# Patient Record
Sex: Male | Born: 1990 | Hispanic: Yes | Marital: Single | Smoking: Never smoker
Health system: Southern US, Community
[De-identification: ages and names within clinical notes are randomized; demographics above are authoritative.]

## PROBLEM LIST (undated history)

## (undated) DIAGNOSIS — D649 Anemia, unspecified: Secondary | ICD-10-CM

## (undated) DIAGNOSIS — W3400XA Accidental discharge from unspecified firearms or gun, initial encounter: Secondary | ICD-10-CM

## (undated) HISTORY — PX: OTHER SURGICAL HISTORY: SHX169

---

## 2014-06-28 DIAGNOSIS — W19XXXA Unspecified fall, initial encounter: Secondary | ICD-10-CM

## 2014-06-28 HISTORY — DX: Unspecified fall, initial encounter: W19.XXXA

## 2017-01-10 ENCOUNTER — Emergency Department: Payer: Self-pay

## 2017-01-10 ENCOUNTER — Encounter: Payer: Self-pay | Admitting: Emergency Medicine

## 2017-01-10 ENCOUNTER — Emergency Department
Admission: EM | Admit: 2017-01-10 | Discharge: 2017-01-10 | Disposition: A | Discharge status: Home / Self Care | Payer: Self-pay | Attending: Emergency Medicine | Admitting: Emergency Medicine

## 2017-01-10 DIAGNOSIS — W11XXXA Fall on and from ladder, initial encounter: Secondary | ICD-10-CM | POA: Insufficient documentation

## 2017-01-10 DIAGNOSIS — Y990 Civilian activity done for income or pay: Secondary | ICD-10-CM | POA: Insufficient documentation

## 2017-01-10 DIAGNOSIS — Y939 Activity, unspecified: Secondary | ICD-10-CM | POA: Insufficient documentation

## 2017-01-10 DIAGNOSIS — Y929 Unspecified place or not applicable: Secondary | ICD-10-CM | POA: Insufficient documentation

## 2017-01-10 DIAGNOSIS — F17200 Nicotine dependence, unspecified, uncomplicated: Secondary | ICD-10-CM | POA: Insufficient documentation

## 2017-01-10 DIAGNOSIS — R51 Headache: Secondary | ICD-10-CM | POA: Insufficient documentation

## 2017-01-10 DIAGNOSIS — S22080A Wedge compression fracture of T11-T12 vertebra, initial encounter for closed fracture: Secondary | ICD-10-CM | POA: Insufficient documentation

## 2017-01-10 LAB — URINALYSIS, COMPLETE (UACMP) WITH MICROSCOPIC
BACTERIA UA: NONE SEEN
BILIRUBIN URINE: NEGATIVE
Glucose, UA: NEGATIVE mg/dL
HGB URINE DIPSTICK: NEGATIVE
Ketones, ur: NEGATIVE mg/dL
LEUKOCYTES UA: NEGATIVE
Nitrite: NEGATIVE
PROTEIN: 30 mg/dL — AB
SPECIFIC GRAVITY, URINE: 1.028 (ref 1.005–1.030)
pH: 6 (ref 5.0–8.0)

## 2017-01-10 MED ORDER — CYCLOBENZAPRINE HCL 10 MG PO TABS: 10.0000 mg | ORAL_TABLET | Freq: Three times a day (TID) | ORAL | 0 refills | Status: DC | PRN

## 2017-01-10 MED ORDER — HYDROCODONE-ACETAMINOPHEN 5-325 MG PO TABS
1.0000 | ORAL_TABLET | Freq: Once | ORAL | Status: AC
  Administered 2017-01-10: 1 via ORAL

## 2017-01-10 MED ORDER — CYCLOBENZAPRINE HCL 10 MG PO TABS
10.0000 mg | ORAL_TABLET | Freq: Once | ORAL | Status: AC
  Administered 2017-01-10: 10 mg via ORAL
  Filled 2017-01-10: qty 1

## 2017-01-10 MED ORDER — NAPROXEN 500 MG PO TABS: 500.0000 mg | ORAL_TABLET | Freq: Two times a day (BID) | ORAL | 0 refills | Status: DC

## 2017-01-10 MED ORDER — HYDROCODONE-ACETAMINOPHEN 5-325 MG PO TABS
ORAL_TABLET | ORAL | Status: AC
  Filled 2017-01-10: qty 1

## 2017-01-10 MED ORDER — NAPROXEN 500 MG PO TABS
500.0000 mg | ORAL_TABLET | Freq: Once | ORAL | Status: AC
  Administered 2017-01-10: 500 mg via ORAL
  Filled 2017-01-10: qty 1

## 2017-01-10 NOTE — Discharge Instructions (Signed)
Schedule an appointment with the back specialist. Return to the ER for symptoms that change or worsen or for new concerns if you are unable to schedule an appointment.

## 2017-01-10 NOTE — ED Provider Notes (Signed)
[facility] Emergency Department Provider Note ____________________________________________  Time seen: Approximately 4:28 PM  I have reviewed the triage vital signs and the nursing notes.   HISTORY  Chief Complaint Back Pain    HPI Mike Sexton is a 26 y.o. male who presents to the emergency department for evaluation after falling about 8 feet from a ladder onto his back at approximately 3pm. He denies loss of consciousness, although he did strike the back of his head on the ground. He has some pain in his neck and back. He denies loss of bowel or bladder control. He denies extremity pain. She has not attempted any alleviating measures for his pain since the incident. He has been ambulatory since the fall.   History reviewed. No pertinent past medical history.  There are no active problems to display for this patient.   History reviewed. No pertinent surgical history.  Prior to Admission medications   Medication Sig Start Date End Date Taking? Authorizing Provider  cyclobenzaprine (FLEXERIL) 10 MG tablet Take 1 tablet (10 mg total) by mouth 3 (three) times daily as needed for muscle spasms. 01/10/17   Trystan Akhtar, Rulon Eisenmenger B, FNP  naproxen (NAPROSYN) 500 MG tablet Take 1 tablet (500 mg total) by mouth 2 (two) times daily with a meal. 01/10/17   Jas Betten B, FNP    Allergies Patient has no known allergies.  No family history on file.  Social History Social History  Substance Use Topics  . Smoking status: Current Some Day Smoker  . Smokeless tobacco: Not on file  . Alcohol use Not on file    Review of Systems Constitutional: Negative for recent illness. Positive for recent injury. Cardiovascular: Negative for chest pain Respiratory: Negative for shortness of breath Musculoskeletal: Positive for neck and lower back pain. Skin: Positive for scalp hematoma  Neurological: Negative for loss of consciousness or loss of bowel or bladder  control  ____________________________________________   PHYSICAL EXAM:  VITAL SIGNS: ED Triage Vitals  Enc Vitals Group     BP 01/10/17 1618 134/71     Pulse Rate 01/10/17 1618 100     Resp 01/10/17 1618 18     Temp 01/10/17 1618 99 F (37.2 C)     Temp Source 01/10/17 1618 Oral     SpO2 01/10/17 1618 97 %     Weight 01/10/17 1618 180 lb (81.6 kg)     Height 01/10/17 1618 5\' 6"  (1.676 m)     Head Circumference --      Peak Flow --      Pain Score 01/10/17 1617 8     Pain Loc --      Pain Edu? --      Excl. in GC? --     Constitutional: Alert and oriented. Well appearing and in no acute distress. Eyes: And entire are clear. Pupils are equal, round, and reactive to light. Pupils are 3 mm.  Head: Palpable hematoma on the parietal aspect of the left scalp. Neck: Midline tenderness in the mid cervical spine with paraspinal tenderness as well. Respiratory: Respirations are even and unlabored. Breath sound audible and clear to auscultation throughout. Musculoskeletal: Pain in the mid and lower thorax and the lumbar spine that worsens with straight leg raise >on right than left.  Neurologic: Awake, alert, and oriented x 4  Skin: Scalp hematoma on the left parietal area. Psychiatric: Behavior appropriate. Normal affect.  ____________________________________________   LABS (all labs ordered are listed, but only abnormal results are  displayed)  Labs Reviewed  URINALYSIS, COMPLETE (UACMP) WITH MICROSCOPIC - Abnormal; Notable for the following:       Result Value   Color, Urine YELLOW (*)    APPearance CLEAR (*)    Protein, ur 30 (*)    Squamous Epithelial / LPF 0-5 (*)    All other components within normal limits   ____________________________________________  RADIOLOGY  EXAM: THORACIC SPINE 2 VIEWS  COMPARISON: Lumbar spine CT obtained earlier today.  FINDINGS: Previously noted approximately 10% superior endplate T12 vertebral body compression deformity. No bony  retropulsion. The remaining vertebrae have normal appearances without fracture or subluxation.  IMPRESSION: Stable approximately 10% superior endplate T12 compression deformity. Otherwise, unremarkable examination.  CT Cervical Spine:  IMPRESSION: 1. No evidence of acute intracranial abnormality. 2. Left parietal scalp hematoma. 3. No acute fracture or subluxation in the cervical spine.  CT Head:  IMPRESSION: 1. No evidence of acute intracranial abnormality. 2. Left parietal scalp hematoma. 3. No acute fracture or subluxation in the cervical spine.  CT Thoracic Spine:  IMPRESSION: 1. T12 compression fracture with minimal vertebral body height loss. 2. 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 3. Hepatic steatosis.  ___________________________________________   PROCEDURES  Procedure(s) performed: None  ____________________________________________   INITIAL IMPRESSION / ASSESSMENT AND PLAN / ED COURSE  Mike Sexton is a 26 y.o. male who presents to the emergency department for evaluation and treatment after falling 6-8 feet from a ladder and landing on his lower back. All images were negative with the exception of the CT scan of his lumbar spine which shows a T12 compression fracture with minimal vertebral body height loss (10% or less.) He will be referred to the neurosurgeon and restricted from work until he is released by his primary care provider or the neurosurgeon. He was given strict emergent return precautions. He was given prescriptions for Flexeril and Naprosyn. He is discharged in the care of his friend who will assist him with the scheduling of appointments.  Pertinent labs & imaging results that were available during my care of the patient were reviewed by me and considered in my medical decision making (see chart for details).  _________________________________________   FINAL CLINICAL IMPRESSION(S) / ED DIAGNOSES  Final diagnoses:   Traumatic compression fracture of T12 thoracic vertebra, closed, initial encounter [facility](HCC)    Discharge Medication List as of 01/10/2017  7:10 PM    START taking these medications   Details  cyclobenzaprine (FLEXERIL) 10 MG tablet Take 1 tablet (10 mg total) by mouth 3 (three) times daily as needed for muscle spasms., Starting Mon 01/10/2017, Print    naproxen (NAPROSYN) 500 MG tablet Take 1 tablet (500 mg total) by mouth 2 (two) times daily with a meal., Starting Mon 01/10/2017, Print        If controlled substance prescribed during this visit, 12 month history viewed on the [facility] prior to issuing an initial prescription for Schedule II or III opiod.    Chinita Pesterriplett, Akia Desroches B, FNP 01/10/17 2204    Phineas SemenGoodman, Graydon, MD 01/10/17 2240

## 2017-01-10 NOTE — ED Triage Notes (Signed)
Fell from ladder onto back approx 3pm. Pain lower back. Fell approx 4 to 6 feet per coworker.

## 2017-01-10 NOTE — ED Triage Notes (Signed)
Denies LOC at fall.

## 2017-01-10 NOTE — ED Notes (Signed)
Discharged patient with translator Mike Sexton. Reviewed d/c instructions, follow-up care, prescriptions with patient. Pt verbalized understanding

## 2017-01-10 NOTE — ED Notes (Signed)
See triage note  States he fell approx 4 ft from ladder  Having pain to lower back  Ambulates slowly d/t increased pain

## 2019-06-12 ENCOUNTER — Encounter: Payer: Self-pay | Admitting: Physician Assistant

## 2019-07-04 ENCOUNTER — Ambulatory Visit: Payer: Self-pay | Admitting: Physician Assistant

## 2020-02-19 ENCOUNTER — Inpatient Hospital Stay (HOSPITAL_COMMUNITY)
Admission: EM | Admit: 2020-02-19 | Discharge: 2020-02-25 | DRG: 964 | Disposition: A | Discharge status: Rehabilitation Facility | Payer: No Typology Code available for payment source | Attending: General Surgery | Admitting: General Surgery

## 2020-02-19 ENCOUNTER — Emergency Department (HOSPITAL_COMMUNITY): Payer: No Typology Code available for payment source

## 2020-02-19 DIAGNOSIS — S27321A Contusion of lung, unilateral, initial encounter: Secondary | ICD-10-CM | POA: Diagnosis present

## 2020-02-19 DIAGNOSIS — S022XXA Fracture of nasal bones, initial encounter for closed fracture: Secondary | ICD-10-CM | POA: Diagnosis present

## 2020-02-19 DIAGNOSIS — S069X0A Unspecified intracranial injury without loss of consciousness, initial encounter: Principal | ICD-10-CM | POA: Diagnosis present

## 2020-02-19 DIAGNOSIS — S0240CA Maxillary fracture, right side, initial encounter for closed fracture: Secondary | ICD-10-CM | POA: Diagnosis present

## 2020-02-19 DIAGNOSIS — J9690 Respiratory failure, unspecified, unspecified whether with hypoxia or hypercapnia: Secondary | ICD-10-CM

## 2020-02-19 DIAGNOSIS — J939 Pneumothorax, unspecified: Secondary | ICD-10-CM

## 2020-02-19 DIAGNOSIS — S0240DA Maxillary fracture, left side, initial encounter for closed fracture: Secondary | ICD-10-CM | POA: Diagnosis present

## 2020-02-19 DIAGNOSIS — S0231XA Fracture of orbital floor, right side, initial encounter for closed fracture: Secondary | ICD-10-CM | POA: Diagnosis present

## 2020-02-19 DIAGNOSIS — Z23 Encounter for immunization: Secondary | ICD-10-CM

## 2020-02-19 DIAGNOSIS — T1490XA Injury, unspecified, initial encounter: Secondary | ICD-10-CM

## 2020-02-19 DIAGNOSIS — S27809A Unspecified injury of diaphragm, initial encounter: Secondary | ICD-10-CM | POA: Diagnosis present

## 2020-02-19 DIAGNOSIS — S0285XA Fracture of orbit, unspecified, initial encounter for closed fracture: Secondary | ICD-10-CM

## 2020-02-19 DIAGNOSIS — Z20822 Contact with and (suspected) exposure to covid-19: Secondary | ICD-10-CM | POA: Diagnosis present

## 2020-02-19 DIAGNOSIS — W3400XA Accidental discharge from unspecified firearms or gun, initial encounter: Secondary | ICD-10-CM | POA: Diagnosis not present

## 2020-02-19 DIAGNOSIS — S272XXA Traumatic hemopneumothorax, initial encounter: Secondary | ICD-10-CM | POA: Diagnosis present

## 2020-02-19 DIAGNOSIS — S0292XA Unspecified fracture of facial bones, initial encounter for closed fracture: Secondary | ICD-10-CM

## 2020-02-19 DIAGNOSIS — Z87828 Personal history of other (healed) physical injury and trauma: Secondary | ICD-10-CM

## 2020-02-19 DIAGNOSIS — S22039A Unspecified fracture of third thoracic vertebra, initial encounter for closed fracture: Secondary | ICD-10-CM | POA: Diagnosis present

## 2020-02-19 DIAGNOSIS — S066X0A Traumatic subarachnoid hemorrhage without loss of consciousness, initial encounter: Secondary | ICD-10-CM | POA: Diagnosis present

## 2020-02-19 LAB — COMPREHENSIVE METABOLIC PANEL
ALT: 114 U/L — ABNORMAL HIGH (ref 0–44)
AST: 80 U/L — ABNORMAL HIGH (ref 15–41)
Albumin: 3.7 g/dL (ref 3.5–5.0)
Alkaline Phosphatase: 42 U/L (ref 38–126)
Anion gap: 14 (ref 5–15)
BUN: 16 mg/dL (ref 6–20)
CO2: 17 mmol/L — ABNORMAL LOW (ref 22–32)
Calcium: 8.7 mg/dL — ABNORMAL LOW (ref 8.9–10.3)
Chloride: 105 mmol/L (ref 98–111)
Creatinine, Ser: 1.02 mg/dL (ref 0.61–1.24)
GFR calc Af Amer: >60 mL/min (ref >60)
GFR calc non Af Amer: >60 mL/min (ref >60)
Glucose, Bld: 220 mg/dL — ABNORMAL HIGH (ref 70–99)
Potassium: 3.4 mmol/L — ABNORMAL LOW (ref 3.5–5.1)
Sodium: 136 mmol/L (ref 135–145)
Total Bilirubin: 1.3 mg/dL — ABNORMAL HIGH (ref 0.3–1.2)
Total Protein: 6.4 g/dL — ABNORMAL LOW (ref 6.5–8.1)

## 2020-02-19 LAB — SARS CORONAVIRUS 2 BY RT PCR (HOSPITAL ORDER, PERFORMED IN ~~LOC~~ HOSPITAL LAB): SARS Coronavirus 2: NEGATIVE

## 2020-02-19 LAB — CBC
HCT: 39 % (ref 39.0–52.0)
HCT: 33.4 % — ABNORMAL LOW (ref 39.0–52.0)
Hemoglobin: 12.6 g/dL — ABNORMAL LOW (ref 13.0–17.0)
Hemoglobin: 11 g/dL — ABNORMAL LOW (ref 13.0–17.0)
MCH: 29 pg (ref 26.0–34.0)
MCH: 29.6 pg (ref 26.0–34.0)
MCHC: 32.3 g/dL (ref 30.0–36.0)
MCHC: 32.9 g/dL (ref 30.0–36.0)
MCV: 89.7 fL (ref 80.0–100.0)
MCV: 89.8 fL (ref 80.0–100.0)
Platelets: 300 10*3/uL (ref 150–400)
Platelets: 261 10*3/uL (ref 150–400)
RBC: 4.35 MIL/uL (ref 4.22–5.81)
RBC: 3.72 MIL/uL — ABNORMAL LOW (ref 4.22–5.81)
RDW: 12.8 % (ref 11.5–15.5)
RDW: 12.9 % (ref 11.5–15.5)
WBC: 9.6 10*3/uL (ref 4.0–10.5)
WBC: 15.5 10*3/uL — ABNORMAL HIGH (ref 4.0–10.5)
nRBC: 0 % (ref 0.0–0.2)
nRBC: 0 % (ref 0.0–0.2)

## 2020-02-19 LAB — URINALYSIS, ROUTINE W REFLEX MICROSCOPIC
Bilirubin Urine: NEGATIVE
Glucose, UA: NEGATIVE mg/dL
Hgb urine dipstick: NEGATIVE
Ketones, ur: NEGATIVE mg/dL
Leukocytes,Ua: NEGATIVE
Nitrite: NEGATIVE
Protein, ur: NEGATIVE mg/dL
Specific Gravity, Urine: >1.046 — ABNORMAL HIGH (ref 1.005–1.030)
pH: 6 (ref 5.0–8.0)

## 2020-02-19 LAB — PROTIME-INR
INR: 1 (ref 0.8–1.2)
Prothrombin Time: 13.2 seconds (ref 11.4–15.2)

## 2020-02-19 LAB — ETHANOL: Alcohol, Ethyl (B): <10 mg/dL (ref <10)

## 2020-02-19 LAB — I-STAT CHEM 8, ED
BUN: 17 mg/dL (ref 6–20)
Calcium, Ion: 1.07 mmol/L — ABNORMAL LOW (ref 1.15–1.40)
Chloride: 104 mmol/L (ref 98–111)
Creatinine, Ser: 0.9 mg/dL (ref 0.61–1.24)
Glucose, Bld: 217 mg/dL — ABNORMAL HIGH (ref 70–99)
HCT: 37 % — ABNORMAL LOW (ref 39.0–52.0)
Hemoglobin: 12.6 g/dL — ABNORMAL LOW (ref 13.0–17.0)
Potassium: 3.4 mmol/L — ABNORMAL LOW (ref 3.5–5.1)
Sodium: 138 mmol/L (ref 135–145)
TCO2: 20 mmol/L — ABNORMAL LOW (ref 22–32)

## 2020-02-19 LAB — SAMPLE TO BLOOD BANK

## 2020-02-19 LAB — LACTIC ACID, PLASMA: Lactic Acid, Venous: 2.5 mmol/L (ref 0.5–1.9)

## 2020-02-19 LAB — HIV ANTIBODY (ROUTINE TESTING W REFLEX): HIV Screen 4th Generation wRfx: NONREACTIVE

## 2020-02-19 LAB — GLUCOSE, CAPILLARY: Glucose-Capillary: 137 mg/dL — ABNORMAL HIGH (ref 70–99)

## 2020-02-19 MED ORDER — ACETAMINOPHEN 325 MG PO TABS
650.0000 mg | ORAL_TABLET | Freq: Four times a day (QID) | ORAL | Status: DC
  Administered 2020-02-19 – 2020-02-20 (×4): 650 mg via ORAL
  Filled 2020-02-19 (×4): qty 2

## 2020-02-19 MED ORDER — ONDANSETRON HCL 4 MG/2ML IJ SOLN
4.0000 mg | Freq: Four times a day (QID) | INTRAMUSCULAR | Status: DC | PRN
  Administered 2020-02-19 – 2020-02-24 (×4): 4 mg via INTRAVENOUS
  Filled 2020-02-19 (×4): qty 2

## 2020-02-19 MED ORDER — LORAZEPAM 2 MG/ML IJ SOLN: 1.0000 mg | INTRAMUSCULAR | Status: DC | PRN

## 2020-02-19 MED ORDER — LEVETIRACETAM IN NACL 500 MG/100ML IV SOLN
500.0000 mg | Freq: Two times a day (BID) | INTRAVENOUS | Status: DC
  Administered 2020-02-19 – 2020-02-25 (×13): 500 mg via INTRAVENOUS
  Filled 2020-02-19 (×13): qty 100

## 2020-02-19 MED ORDER — OXYCODONE HCL 5 MG PO TABS
10.0000 mg | ORAL_TABLET | ORAL | Status: DC | PRN
  Administered 2020-02-19 – 2020-02-25 (×22): 10 mg via ORAL
  Filled 2020-02-19 (×23): qty 2

## 2020-02-19 MED ORDER — OXYCODONE HCL 5 MG PO TABS
5.0000 mg | ORAL_TABLET | ORAL | Status: DC | PRN
  Administered 2020-02-20 – 2020-02-24 (×3): 5 mg via ORAL
  Filled 2020-02-19 (×3): qty 1

## 2020-02-19 MED ORDER — GABAPENTIN 300 MG PO CAPS
300.0000 mg | ORAL_CAPSULE | Freq: Three times a day (TID) | ORAL | Status: DC
  Administered 2020-02-19 – 2020-02-22 (×10): 300 mg via ORAL
  Filled 2020-02-19 (×10): qty 1

## 2020-02-19 MED ORDER — LEVETIRACETAM 500 MG PO TABS: 500.0000 mg | ORAL_TABLET | Freq: Two times a day (BID) | ORAL | Status: DC

## 2020-02-19 MED ORDER — PROCHLORPERAZINE EDISYLATE 10 MG/2ML IJ SOLN
10.0000 mg | Freq: Four times a day (QID) | INTRAMUSCULAR | Status: DC | PRN
  Administered 2020-02-19: 10 mg via INTRAVENOUS
  Filled 2020-02-19: qty 2

## 2020-02-19 MED ORDER — SODIUM CHLORIDE 0.9 % IV SOLN: Freq: Once | INTRAVENOUS | Status: AC

## 2020-02-19 MED ORDER — TETANUS-DIPHTH-ACELL PERTUSSIS 5-2.5-18.5 LF-MCG/0.5 IM SUSP
INTRAMUSCULAR | Status: AC
  Administered 2020-02-19: 0.5 mL via INTRAMUSCULAR
  Filled 2020-02-19: qty 0.5

## 2020-02-19 MED ORDER — CHLORHEXIDINE GLUCONATE CLOTH 2 % EX PADS
6.0000 | MEDICATED_PAD | Freq: Every day | CUTANEOUS | Status: DC
  Administered 2020-02-23 – 2020-02-25 (×3): 6 via TOPICAL

## 2020-02-19 MED ORDER — POTASSIUM CHLORIDE IN NACL 20-0.9 MEQ/L-% IV SOLN
INTRAVENOUS | Status: DC
  Filled 2020-02-19 (×3): qty 1000

## 2020-02-19 MED ORDER — ONDANSETRON 4 MG PO TBDP: 4.0000 mg | ORAL_TABLET | Freq: Four times a day (QID) | ORAL | Status: DC | PRN

## 2020-02-19 MED ORDER — FENTANYL CITRATE (PF) 100 MCG/2ML IJ SOLN
INTRAMUSCULAR | Status: AC | PRN
  Administered 2020-02-19: 50 ug via INTRAVENOUS

## 2020-02-19 MED ORDER — CEFAZOLIN SODIUM-DEXTROSE 2-4 GM/100ML-% IV SOLN
2.0000 g | Freq: Once | INTRAVENOUS | Status: AC
  Administered 2020-02-19: 2 g via INTRAVENOUS
  Filled 2020-02-19: qty 100

## 2020-02-19 MED ORDER — HYDROMORPHONE HCL 1 MG/ML IJ SOLN
0.5000 mg | INTRAMUSCULAR | Status: DC | PRN
  Administered 2020-02-19 – 2020-02-20 (×5): 1 mg via INTRAVENOUS
  Administered 2020-02-21 (×2): 0.5 mg via INTRAVENOUS
  Administered 2020-02-21: 1 mg via INTRAVENOUS
  Administered 2020-02-22: 0.5 mg via INTRAVENOUS
  Filled 2020-02-19 (×11): qty 1

## 2020-02-19 MED ORDER — METHOCARBAMOL 1000 MG/10ML IJ SOLN
1000.0000 mg | Freq: Three times a day (TID) | INTRAVENOUS | Status: DC | PRN
  Filled 2020-02-19 (×2): qty 10

## 2020-02-19 MED ORDER — FENTANYL CITRATE (PF) 100 MCG/2ML IJ SOLN
INTRAMUSCULAR | Status: AC
  Filled 2020-02-19: qty 2

## 2020-02-19 MED ORDER — IOHEXOL 300 MG/ML  SOLN
100.0000 mL | Freq: Once | INTRAMUSCULAR | Status: AC | PRN
  Administered 2020-02-19: 100 mL via INTRAVENOUS

## 2020-02-19 MED ORDER — ENOXAPARIN SODIUM 30 MG/0.3ML ~~LOC~~ SOLN: 30.0000 mg | Freq: Two times a day (BID) | SUBCUTANEOUS | Status: DC

## 2020-02-19 NOTE — Progress Notes (Signed)
Orthopedic Tech Progress Note Patient Details:  Mike Sexton 06/13/1991 [id number] Called in order to [employer] for a CTO Patient ID: Mike Sexton, male   DOB: [birth year], 29 y.o.   MRN: [id number]   [name] 02/19/2020, 9:32 AM
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
## 2020-02-19 NOTE — Progress Notes (Signed)
Rehab Admissions Coordinator Note:  Patient was screened by Clois Dupes for appropriateness for an Inpatient Acute Rehab Consult per PT recs. Await further progress with therapy before pursuing rehab consult/assesment. I will follow.   Clois Dupes RN MSN 02/19/2020, 4:42 PM  I can be reached at [phone].
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
## 2020-02-19 NOTE — ED Notes (Signed)
Family updated as to patient's status.
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
## 2020-02-20 ENCOUNTER — Inpatient Hospital Stay (HOSPITAL_COMMUNITY): Payer: No Typology Code available for payment source

## 2020-02-20 LAB — CBC
HCT: 29.3 % — ABNORMAL LOW (ref 39.0–52.0)
Hemoglobin: 9.7 g/dL — ABNORMAL LOW (ref 13.0–17.0)
MCH: 29.8 pg (ref 26.0–34.0)
MCHC: 33.1 g/dL (ref 30.0–36.0)
MCV: 89.9 fL (ref 80.0–100.0)
Platelets: 206 10*3/uL (ref 150–400)
RBC: 3.26 MIL/uL — ABNORMAL LOW (ref 4.22–5.81)
RDW: 13.2 % (ref 11.5–15.5)
WBC: 11.1 10*3/uL — ABNORMAL HIGH (ref 4.0–10.5)
nRBC: 0 % (ref 0.0–0.2)

## 2020-02-20 LAB — BASIC METABOLIC PANEL
Anion gap: 7 (ref 5–15)
BUN: 13 mg/dL (ref 6–20)
CO2: 23 mmol/L (ref 22–32)
Calcium: 8 mg/dL — ABNORMAL LOW (ref 8.9–10.3)
Chloride: 104 mmol/L (ref 98–111)
Creatinine, Ser: 0.65 mg/dL (ref 0.61–1.24)
GFR calc Af Amer: >60 mL/min (ref >60)
GFR calc non Af Amer: >60 mL/min (ref >60)
Glucose, Bld: 141 mg/dL — ABNORMAL HIGH (ref 70–99)
Potassium: 4.2 mmol/L (ref 3.5–5.1)
Sodium: 134 mmol/L — ABNORMAL LOW (ref 135–145)

## 2020-02-20 LAB — MRSA PCR SCREENING: MRSA by PCR: NEGATIVE

## 2020-02-20 MED ORDER — LORAZEPAM 2 MG/ML IJ SOLN: 0.5000 mg | INTRAMUSCULAR | Status: DC | PRN

## 2020-02-20 MED ORDER — ACETAMINOPHEN 500 MG PO TABS
1000.0000 mg | ORAL_TABLET | Freq: Four times a day (QID) | ORAL | Status: DC
  Administered 2020-02-20 – 2020-02-25 (×20): 1000 mg via ORAL
  Filled 2020-02-20 (×21): qty 2

## 2020-02-20 MED ORDER — METHOCARBAMOL 500 MG PO TABS
1000.0000 mg | ORAL_TABLET | Freq: Three times a day (TID) | ORAL | Status: DC
  Administered 2020-02-20 – 2020-02-25 (×17): 1000 mg via ORAL
  Filled 2020-02-20 (×18): qty 2

## 2020-02-20 MED ORDER — LACTATED RINGERS IV BOLUS
1000.0000 mL | Freq: Once | INTRAVENOUS | Status: AC
  Administered 2020-02-20: 1000 mL via INTRAVENOUS
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
## 2020-02-20 NOTE — Progress Notes (Signed)
Inpatient Rehabilitation Admissions Coordinator  Inpt rehab consult for assessment is recommended. Please place order for rehab consult when you feel appropriate. Please advise.  Ottie Glazier, RN, MSN Rehab Admissions Coordinator [phone] 02/20/2020 12:17 PM
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
## 2020-02-20 NOTE — Progress Notes (Signed)
Patient unchanged overnight.  Reviewed note from ophthalmology yesterday afternoon recommending oculoplastics consultation due to extraocular movement restriction.  This is likely due to the posterolateral orbital fracture and bony fragment impairing function of the lateral rectus.  This is outside of my scope of practice, and I would defer to ophthalmology and oculoplastics colleagues for any surgical recommendations.  No ENT related intervention needed at this time.

## 2020-02-20 NOTE — Plan of Care (Signed)
  Problem: Clinical Measurements: Goal: Will remain free from infection Outcome: Progressing Goal: Respiratory complications will improve Outcome: Progressing Goal: Cardiovascular complication will be avoided Outcome: Progressing   Problem: Activity: Goal: Risk for activity intolerance will decrease Outcome: Progressing   Problem: Nutrition: Goal: Adequate nutrition will be maintained Outcome: Progressing   Problem: Pain Managment: Goal: General experience of comfort will improve Outcome: Progressing   Problem: Safety: Goal: Ability to remain free from injury will improve Outcome: Progressing   Problem: Skin Integrity: Goal: Risk for impaired skin integrity will decrease Outcome: Progressing   Problem: Elimination: Goal: Will not experience complications related to bowel motility Outcome: Not Progressing Goal: Will not experience complications related to urinary retention Outcome: Not Progressing
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
## 2020-02-21 ENCOUNTER — Encounter (HOSPITAL_COMMUNITY): Payer: Self-pay | Admitting: General Surgery

## 2020-02-21 DIAGNOSIS — W3400XA Accidental discharge from unspecified firearms or gun, initial encounter: Secondary | ICD-10-CM

## 2020-02-21 DIAGNOSIS — G5721 Lesion of femoral nerve, right lower limb: Secondary | ICD-10-CM

## 2020-02-21 DIAGNOSIS — G5711 Meralgia paresthetica, right lower limb: Secondary | ICD-10-CM

## 2020-02-21 LAB — CBC
HCT: 25.3 % — ABNORMAL LOW (ref 39.0–52.0)
Hemoglobin: 8.4 g/dL — ABNORMAL LOW (ref 13.0–17.0)
MCH: 29.5 pg (ref 26.0–34.0)
MCHC: 33.2 g/dL (ref 30.0–36.0)
MCV: 88.8 fL (ref 80.0–100.0)
Platelets: 212 10*3/uL (ref 150–400)
RBC: 2.85 MIL/uL — ABNORMAL LOW (ref 4.22–5.81)
RDW: 12.7 % (ref 11.5–15.5)
WBC: 9.9 10*3/uL (ref 4.0–10.5)
nRBC: 0 % (ref 0.0–0.2)

## 2020-02-21 MED ORDER — TAMSULOSIN HCL 0.4 MG PO CAPS
0.4000 mg | ORAL_CAPSULE | Freq: Every day | ORAL | Status: DC
  Administered 2020-02-21 – 2020-02-25 (×5): 0.4 mg via ORAL
  Filled 2020-02-21 (×5): qty 1

## 2020-02-21 MED ORDER — POTASSIUM CHLORIDE IN NACL 20-0.9 MEQ/L-% IV SOLN
INTRAVENOUS | Status: DC
  Filled 2020-02-21 (×6): qty 1000

## 2020-02-21 MED ORDER — SODIUM CHLORIDE 0.9 % IV SOLN: INTRAVENOUS | Status: DC

## 2020-02-21 MED ORDER — BETHANECHOL CHLORIDE 25 MG PO TABS
25.0000 mg | ORAL_TABLET | Freq: Three times a day (TID) | ORAL | Status: DC
  Administered 2020-02-21 – 2020-02-25 (×13): 25 mg via ORAL
  Filled 2020-02-21 (×9): qty 1
  Filled 2020-02-21: qty 3
  Filled 2020-02-21 (×3): qty 1
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
## 2020-02-21 NOTE — Progress Notes (Signed)
Pt arrived to 4NP06 from 4NICU. Report received from [place], [place].   Robina Ade, RN
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
## 2020-02-22 ENCOUNTER — Inpatient Hospital Stay (HOSPITAL_COMMUNITY): Payer: No Typology Code available for payment source

## 2020-02-22 LAB — BASIC METABOLIC PANEL
Anion gap: 8 (ref 5–15)
BUN: 7 mg/dL (ref 6–20)
CO2: 27 mmol/L (ref 22–32)
Calcium: 8.3 mg/dL — ABNORMAL LOW (ref 8.9–10.3)
Chloride: 100 mmol/L (ref 98–111)
Creatinine, Ser: 0.76 mg/dL (ref 0.61–1.24)
GFR calc Af Amer: >60 mL/min (ref >60)
GFR calc non Af Amer: >60 mL/min (ref >60)
Glucose, Bld: 116 mg/dL — ABNORMAL HIGH (ref 70–99)
Potassium: 3.7 mmol/L (ref 3.5–5.1)
Sodium: 135 mmol/L (ref 135–145)

## 2020-02-22 LAB — CBC
HCT: 26.6 % — ABNORMAL LOW (ref 39.0–52.0)
Hemoglobin: 8.8 g/dL — ABNORMAL LOW (ref 13.0–17.0)
MCH: 29.1 pg (ref 26.0–34.0)
MCHC: 33.1 g/dL (ref 30.0–36.0)
MCV: 88.1 fL (ref 80.0–100.0)
Platelets: 233 10*3/uL (ref 150–400)
RBC: 3.02 MIL/uL — ABNORMAL LOW (ref 4.22–5.81)
RDW: 12.7 % (ref 11.5–15.5)
WBC: 9.1 10*3/uL (ref 4.0–10.5)
nRBC: 0 % (ref 0.0–0.2)

## 2020-02-22 MED ORDER — KETOROLAC TROMETHAMINE 15 MG/ML IJ SOLN
30.0000 mg | Freq: Four times a day (QID) | INTRAMUSCULAR | Status: DC
  Administered 2020-02-22 – 2020-02-25 (×13): 30 mg via INTRAVENOUS
  Filled 2020-02-22 (×13): qty 2

## 2020-02-22 MED ORDER — HYDROMORPHONE HCL 1 MG/ML IJ SOLN
0.5000 mg | Freq: Four times a day (QID) | INTRAMUSCULAR | Status: DC | PRN
  Administered 2020-02-24: 0.5 mg via INTRAVENOUS
  Filled 2020-02-22: qty 1

## 2020-02-22 MED ORDER — GABAPENTIN 300 MG PO CAPS
600.0000 mg | ORAL_CAPSULE | Freq: Three times a day (TID) | ORAL | Status: DC
  Administered 2020-02-22 – 2020-02-25 (×9): 600 mg via ORAL
  Filled 2020-02-22 (×9): qty 2

## 2020-02-22 MED ORDER — LIDOCAINE 5 % EX PTCH
1.0000 | MEDICATED_PATCH | CUTANEOUS | Status: DC
  Administered 2020-02-22 – 2020-02-25 (×4): 1 via TRANSDERMAL
  Filled 2020-02-22 (×4): qty 1
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
## 2020-02-23 NOTE — Progress Notes (Signed)
Patient bladder scanned and 408 recorded. Dr. Dwain Sarna notified and orders given to place foley. Patient tolerated well.
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
## 2020-02-24 LAB — BASIC METABOLIC PANEL
Anion gap: 8 (ref 5–15)
BUN: 7 mg/dL (ref 6–20)
CO2: 25 mmol/L (ref 22–32)
Calcium: 8.5 mg/dL — ABNORMAL LOW (ref 8.9–10.3)
Chloride: 103 mmol/L (ref 98–111)
Creatinine, Ser: 0.67 mg/dL (ref 0.61–1.24)
GFR calc Af Amer: >60 mL/min (ref >60)
GFR calc non Af Amer: >60 mL/min (ref >60)
Glucose, Bld: 107 mg/dL — ABNORMAL HIGH (ref 70–99)
Potassium: 4 mmol/L (ref 3.5–5.1)
Sodium: 136 mmol/L (ref 135–145)

## 2020-02-24 LAB — GLUCOSE, CAPILLARY: Glucose-Capillary: 89 mg/dL (ref 70–99)

## 2020-02-24 LAB — CBC
HCT: 26.4 % — ABNORMAL LOW (ref 39.0–52.0)
Hemoglobin: 8.7 g/dL — ABNORMAL LOW (ref 13.0–17.0)
MCH: 28.9 pg (ref 26.0–34.0)
MCHC: 33 g/dL (ref 30.0–36.0)
MCV: 87.7 fL (ref 80.0–100.0)
Platelets: 296 10*3/uL (ref 150–400)
RBC: 3.01 MIL/uL — ABNORMAL LOW (ref 4.22–5.81)
RDW: 12.9 % (ref 11.5–15.5)
WBC: 7.5 10*3/uL (ref 4.0–10.5)
nRBC: 0.5 % — ABNORMAL HIGH (ref 0.0–0.2)
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
## 2020-02-25 ENCOUNTER — Encounter (HOSPITAL_COMMUNITY): Payer: Self-pay | Admitting: Physical Medicine & Rehabilitation

## 2020-02-25 ENCOUNTER — Other Ambulatory Visit: Payer: Self-pay

## 2020-02-25 ENCOUNTER — Inpatient Hospital Stay (HOSPITAL_COMMUNITY): Payer: No Typology Code available for payment source

## 2020-02-25 ENCOUNTER — Inpatient Hospital Stay (HOSPITAL_COMMUNITY)
Admission: RE | Admit: 2020-02-25 | Discharge: 2020-03-21 | DRG: 560 | Disposition: A | Discharge status: Home Health | Payer: No Typology Code available for payment source | Source: Intra-hospital | Attending: Physical Medicine & Rehabilitation | Admitting: Physical Medicine & Rehabilitation

## 2020-02-25 ENCOUNTER — Encounter: Payer: Self-pay | Admitting: Emergency Medicine

## 2020-02-25 DIAGNOSIS — F419 Anxiety disorder, unspecified: Secondary | ICD-10-CM | POA: Diagnosis not present

## 2020-02-25 DIAGNOSIS — S066X9D Traumatic subarachnoid hemorrhage with loss of consciousness of unspecified duration, subsequent encounter: Secondary | ICD-10-CM | POA: Diagnosis not present

## 2020-02-25 DIAGNOSIS — W3400XD Accidental discharge from unspecified firearms or gun, subsequent encounter: Secondary | ICD-10-CM | POA: Diagnosis not present

## 2020-02-25 DIAGNOSIS — G5791 Unspecified mononeuropathy of right lower limb: Secondary | ICD-10-CM | POA: Diagnosis present

## 2020-02-25 DIAGNOSIS — R339 Retention of urine, unspecified: Secondary | ICD-10-CM

## 2020-02-25 DIAGNOSIS — G5711 Meralgia paresthetica, right lower limb: Secondary | ICD-10-CM | POA: Diagnosis present

## 2020-02-25 DIAGNOSIS — S22031D Stable burst fracture of third thoracic vertebra, subsequent encounter for fracture with routine healing: Secondary | ICD-10-CM

## 2020-02-25 DIAGNOSIS — I959 Hypotension, unspecified: Secondary | ICD-10-CM | POA: Diagnosis not present

## 2020-02-25 DIAGNOSIS — K5900 Constipation, unspecified: Secondary | ICD-10-CM | POA: Diagnosis present

## 2020-02-25 DIAGNOSIS — W3400XA Accidental discharge from unspecified firearms or gun, initial encounter: Secondary | ICD-10-CM

## 2020-02-25 DIAGNOSIS — E876 Hypokalemia: Secondary | ICD-10-CM

## 2020-02-25 DIAGNOSIS — S0285XA Fracture of orbit, unspecified, initial encounter for closed fracture: Secondary | ICD-10-CM | POA: Diagnosis present

## 2020-02-25 DIAGNOSIS — Z87828 Personal history of other (healed) physical injury and trauma: Secondary | ICD-10-CM

## 2020-02-25 DIAGNOSIS — G5721 Lesion of femoral nerve, right lower limb: Secondary | ICD-10-CM | POA: Diagnosis present

## 2020-02-25 DIAGNOSIS — S27809D Unspecified injury of diaphragm, subsequent encounter: Secondary | ICD-10-CM | POA: Diagnosis not present

## 2020-02-25 DIAGNOSIS — K5903 Drug induced constipation: Secondary | ICD-10-CM | POA: Diagnosis not present

## 2020-02-25 DIAGNOSIS — D62 Acute posthemorrhagic anemia: Secondary | ICD-10-CM

## 2020-02-25 DIAGNOSIS — S0219XD Other fracture of base of skull, subsequent encounter for fracture with routine healing: Secondary | ICD-10-CM | POA: Diagnosis present

## 2020-02-25 DIAGNOSIS — S0285XD Fracture of orbit, unspecified, subsequent encounter for fracture with routine healing: Secondary | ICD-10-CM

## 2020-02-25 DIAGNOSIS — S022XXD Fracture of nasal bones, subsequent encounter for fracture with routine healing: Secondary | ICD-10-CM

## 2020-02-25 DIAGNOSIS — R0989 Other specified symptoms and signs involving the circulatory and respiratory systems: Secondary | ICD-10-CM

## 2020-02-25 DIAGNOSIS — Z833 Family history of diabetes mellitus: Secondary | ICD-10-CM | POA: Diagnosis not present

## 2020-02-25 MED ORDER — CHLORHEXIDINE GLUCONATE CLOTH 2 % EX PADS: 6.0000 | MEDICATED_PAD | Freq: Every day | CUTANEOUS | Status: DC

## 2020-02-25 MED ORDER — GABAPENTIN 300 MG PO CAPS
600.0000 mg | ORAL_CAPSULE | Freq: Three times a day (TID) | ORAL | Status: DC
  Administered 2020-02-25 – 2020-03-21 (×75): 600 mg via ORAL
  Filled 2020-02-25 (×52): qty 2
  Filled 2020-02-25: qty 6
  Filled 2020-02-25 (×23): qty 2

## 2020-02-25 MED ORDER — BETHANECHOL CHLORIDE 25 MG PO TABS
25.0000 mg | ORAL_TABLET | Freq: Three times a day (TID) | ORAL | Status: DC
  Administered 2020-02-25 – 2020-03-17 (×63): 25 mg via ORAL
  Filled 2020-02-25 (×67): qty 1

## 2020-02-25 MED ORDER — LIDOCAINE 5 % EX PTCH
1.0000 | MEDICATED_PATCH | CUTANEOUS | Status: DC
  Administered 2020-02-26 – 2020-03-21 (×25): 1 via TRANSDERMAL
  Filled 2020-02-25 (×24): qty 1

## 2020-02-25 MED ORDER — LEVETIRACETAM 500 MG PO TABS
500.0000 mg | ORAL_TABLET | Freq: Two times a day (BID) | ORAL | Status: DC
  Administered 2020-02-25 – 2020-02-27 (×4): 500 mg via ORAL
  Filled 2020-02-25 (×4): qty 1

## 2020-02-25 MED ORDER — HYDROMORPHONE HCL 1 MG/ML IJ SOLN: 0.5000 mg | Freq: Three times a day (TID) | INTRAMUSCULAR | Status: DC | PRN

## 2020-02-25 MED ORDER — NAPHAZOLINE-GLYCERIN 0.012-0.2 % OP SOLN
2.0000 [drp] | Freq: Three times a day (TID) | OPHTHALMIC | Status: DC
  Administered 2020-02-25 – 2020-03-21 (×98): 2 [drp] via OPHTHALMIC
  Filled 2020-02-25: qty 15

## 2020-02-25 MED ORDER — FAMOTIDINE 20 MG PO TABS
20.0000 mg | ORAL_TABLET | Freq: Two times a day (BID) | ORAL | Status: DC
  Administered 2020-02-25 – 2020-03-21 (×50): 20 mg via ORAL
  Filled 2020-02-25 (×52): qty 1

## 2020-02-25 MED ORDER — FLEET ENEMA 7-19 GM/118ML RE ENEM: 1.0000 | ENEMA | Freq: Once | RECTAL | Status: DC | PRN

## 2020-02-25 MED ORDER — ACETAMINOPHEN 325 MG PO TABS
650.0000 mg | ORAL_TABLET | Freq: Three times a day (TID) | ORAL | Status: DC
  Administered 2020-02-25: 650 mg via ORAL
  Administered 2020-02-25: 325 mg via ORAL
  Administered 2020-02-26 – 2020-03-21 (×97): 650 mg via ORAL
  Filled 2020-02-25 (×99): qty 2

## 2020-02-25 MED ORDER — POLYETHYLENE GLYCOL 3350 17 G PO PACK: 17.0000 g | PACK | Freq: Every day | ORAL | Status: DC | PRN

## 2020-02-25 MED ORDER — OXYCODONE HCL 5 MG PO TABS
5.0000 mg | ORAL_TABLET | ORAL | Status: DC | PRN
  Administered 2020-02-26 – 2020-03-05 (×22): 10 mg via ORAL
  Administered 2020-03-09: 5 mg via ORAL
  Administered 2020-03-10 – 2020-03-11 (×3): 10 mg via ORAL
  Administered 2020-03-11: 5 mg via ORAL
  Administered 2020-03-11 – 2020-03-18 (×6): 10 mg via ORAL
  Filled 2020-02-25 (×2): qty 2
  Filled 2020-02-25: qty 1
  Filled 2020-02-25 (×27): qty 2
  Filled 2020-02-25: qty 1
  Filled 2020-02-25 (×6): qty 2

## 2020-02-25 MED ORDER — POLYETHYLENE GLYCOL 3350 17 G PO PACK
17.0000 g | PACK | Freq: Two times a day (BID) | ORAL | Status: DC
  Administered 2020-02-25 – 2020-03-21 (×47): 17 g via ORAL
  Filled 2020-02-25 (×48): qty 1

## 2020-02-25 MED ORDER — ONDANSETRON HCL 4 MG/2ML IJ SOLN: 4.0000 mg | Freq: Four times a day (QID) | INTRAMUSCULAR | Status: DC | PRN

## 2020-02-25 MED ORDER — TRAZODONE HCL 50 MG PO TABS
25.0000 mg | ORAL_TABLET | Freq: Every evening | ORAL | Status: DC | PRN
  Administered 2020-02-28: 25 mg via ORAL
  Filled 2020-02-25 (×2): qty 1

## 2020-02-25 MED ORDER — TRAMADOL HCL 50 MG PO TABS
50.0000 mg | ORAL_TABLET | Freq: Four times a day (QID) | ORAL | Status: DC | PRN
  Administered 2020-02-25 – 2020-03-02 (×7): 50 mg via ORAL
  Filled 2020-02-25 (×8): qty 1

## 2020-02-25 MED ORDER — DIPHENHYDRAMINE HCL 12.5 MG/5ML PO ELIX: 12.5000 mg | ORAL_SOLUTION | Freq: Four times a day (QID) | ORAL | Status: DC | PRN

## 2020-02-25 MED ORDER — ONDANSETRON 4 MG PO TBDP
4.0000 mg | ORAL_TABLET | Freq: Four times a day (QID) | ORAL | Status: DC | PRN
  Administered 2020-03-01: 4 mg via ORAL
  Filled 2020-02-25: qty 1

## 2020-02-25 MED ORDER — PROCHLORPERAZINE 25 MG RE SUPP: 12.5000 mg | Freq: Four times a day (QID) | RECTAL | Status: DC | PRN

## 2020-02-25 MED ORDER — LIDOCAINE HCL URETHRAL/MUCOSAL 2 % EX GEL: CUTANEOUS | Status: DC | PRN

## 2020-02-25 MED ORDER — METHOCARBAMOL 500 MG PO TABS
1000.0000 mg | ORAL_TABLET | Freq: Three times a day (TID) | ORAL | Status: DC
  Administered 2020-02-25 – 2020-03-21 (×74): 1000 mg via ORAL
  Filled 2020-02-25 (×74): qty 2

## 2020-02-25 MED ORDER — LORAZEPAM 2 MG/ML IJ SOLN: 0.5000 mg | INTRAMUSCULAR | Status: DC | PRN

## 2020-02-25 MED ORDER — PROCHLORPERAZINE MALEATE 5 MG PO TABS: 5.0000 mg | ORAL_TABLET | Freq: Four times a day (QID) | ORAL | Status: DC | PRN

## 2020-02-25 MED ORDER — ACETAMINOPHEN 325 MG PO TABS
325.0000 mg | ORAL_TABLET | ORAL | Status: DC | PRN
  Administered 2020-02-28: 650 mg via ORAL
  Administered 2020-03-07: 325 mg via ORAL
  Filled 2020-02-25 (×2): qty 2

## 2020-02-25 MED ORDER — BISACODYL 10 MG RE SUPP
10.0000 mg | Freq: Every day | RECTAL | Status: DC | PRN
  Filled 2020-02-25: qty 1

## 2020-02-25 MED ORDER — KETOROLAC TROMETHAMINE 15 MG/ML IJ SOLN
30.0000 mg | Freq: Four times a day (QID) | INTRAMUSCULAR | Status: AC
  Administered 2020-02-25 – 2020-02-27 (×6): 30 mg via INTRAVENOUS
  Filled 2020-02-25 (×10): qty 2

## 2020-02-25 MED ORDER — PROCHLORPERAZINE EDISYLATE 10 MG/2ML IJ SOLN: 5.0000 mg | Freq: Four times a day (QID) | INTRAMUSCULAR | Status: DC | PRN

## 2020-02-25 MED ORDER — ALUM & MAG HYDROXIDE-SIMETH 200-200-20 MG/5ML PO SUSP: 30.0000 mL | ORAL | Status: DC | PRN

## 2020-02-25 MED ORDER — GUAIFENESIN-DM 100-10 MG/5ML PO SYRP: 5.0000 mL | ORAL_SOLUTION | Freq: Four times a day (QID) | ORAL | Status: DC | PRN

## 2020-02-25 MED ORDER — TAMSULOSIN HCL 0.4 MG PO CAPS
0.4000 mg | ORAL_CAPSULE | Freq: Every day | ORAL | Status: DC
  Administered 2020-02-26 – 2020-03-21 (×25): 0.4 mg via ORAL
  Filled 2020-02-25 (×27): qty 1
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
## 2020-02-25 NOTE — Progress Notes (Signed)
Inpatient Rehab Admissions Coordinator:   I have a bed available for pt to admit to CIR today.  Barnetta Chapel, PA-C, in agreement.  Will let pt/family and TOC team know.   Estill Dooms, PT, DPT Admissions Coordinator [phone] 02/25/20  10:45 AM

## 2020-02-26 ENCOUNTER — Inpatient Hospital Stay (HOSPITAL_COMMUNITY): Payer: Self-pay

## 2020-02-26 ENCOUNTER — Inpatient Hospital Stay (HOSPITAL_COMMUNITY): Payer: Self-pay | Admitting: Occupational Therapy

## 2020-02-26 ENCOUNTER — Inpatient Hospital Stay (HOSPITAL_COMMUNITY): Payer: Self-pay | Admitting: Physical Therapy

## 2020-02-26 ENCOUNTER — Inpatient Hospital Stay (HOSPITAL_COMMUNITY): Payer: No Typology Code available for payment source

## 2020-02-26 ENCOUNTER — Encounter (HOSPITAL_COMMUNITY): Payer: Self-pay | Admitting: Physical Medicine & Rehabilitation

## 2020-02-26 DIAGNOSIS — S069X0S Unspecified intracranial injury without loss of consciousness, sequela: Secondary | ICD-10-CM

## 2020-02-26 DIAGNOSIS — K5901 Slow transit constipation: Secondary | ICD-10-CM

## 2020-02-26 DIAGNOSIS — G5721 Lesion of femoral nerve, right lower limb: Secondary | ICD-10-CM

## 2020-02-26 DIAGNOSIS — G5711 Meralgia paresthetica, right lower limb: Secondary | ICD-10-CM

## 2020-02-26 LAB — COMPREHENSIVE METABOLIC PANEL
ALT: 171 U/L — ABNORMAL HIGH (ref 0–44)
AST: 60 U/L — ABNORMAL HIGH (ref 15–41)
Albumin: 2.9 g/dL — ABNORMAL LOW (ref 3.5–5.0)
Alkaline Phosphatase: 57 U/L (ref 38–126)
Anion gap: 10 (ref 5–15)
BUN: 11 mg/dL (ref 6–20)
CO2: 26 mmol/L (ref 22–32)
Calcium: 9 mg/dL (ref 8.9–10.3)
Chloride: 98 mmol/L (ref 98–111)
Creatinine, Ser: 0.79 mg/dL (ref 0.61–1.24)
GFR calc Af Amer: >60 mL/min (ref >60)
GFR calc non Af Amer: >60 mL/min (ref >60)
Glucose, Bld: 111 mg/dL — ABNORMAL HIGH (ref 70–99)
Potassium: 4.4 mmol/L (ref 3.5–5.1)
Sodium: 134 mmol/L — ABNORMAL LOW (ref 135–145)
Total Bilirubin: 1.1 mg/dL (ref 0.3–1.2)
Total Protein: 6.1 g/dL — ABNORMAL LOW (ref 6.5–8.1)

## 2020-02-26 LAB — CBC WITH DIFFERENTIAL/PLATELET
Abs Immature Granulocytes: 0.07 10*3/uL (ref 0.00–0.07)
Basophils Absolute: 0.1 10*3/uL (ref 0.0–0.1)
Basophils Relative: 1 %
Eosinophils Absolute: 0.2 10*3/uL (ref 0.0–0.5)
Eosinophils Relative: 2 %
HCT: 30 % — ABNORMAL LOW (ref 39.0–52.0)
Hemoglobin: 9.8 g/dL — ABNORMAL LOW (ref 13.0–17.0)
Immature Granulocytes: 1 %
Lymphocytes Relative: 19 %
Lymphs Abs: 1.7 10*3/uL (ref 0.7–4.0)
MCH: 28.7 pg (ref 26.0–34.0)
MCHC: 32.7 g/dL (ref 30.0–36.0)
MCV: 87.7 fL (ref 80.0–100.0)
Monocytes Absolute: 0.9 10*3/uL (ref 0.1–1.0)
Monocytes Relative: 10 %
Neutro Abs: 6.3 10*3/uL (ref 1.7–7.7)
Neutrophils Relative %: 67 %
Platelets: 345 10*3/uL (ref 150–400)
RBC: 3.42 MIL/uL — ABNORMAL LOW (ref 4.22–5.81)
RDW: 13.3 % (ref 11.5–15.5)
WBC: 9.2 10*3/uL (ref 4.0–10.5)
nRBC: 0.2 % (ref 0.0–0.2)

## 2020-02-26 MED ORDER — SORBITOL 70 % SOLN
60.0000 mL | Status: AC
  Administered 2020-02-26: 60 mL via ORAL
  Filled 2020-02-26: qty 60

## 2020-02-26 MED ORDER — ENOXAPARIN SODIUM 30 MG/0.3ML ~~LOC~~ SOLN
30.0000 mg | Freq: Two times a day (BID) | SUBCUTANEOUS | Status: DC
  Administered 2020-02-26 – 2020-03-21 (×48): 30 mg via SUBCUTANEOUS
  Filled 2020-02-26 (×48): qty 0.3
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
## 2020-02-26 NOTE — Progress Notes (Signed)
Lower extremity venous has been completed.   Preliminary results in CV Proc.   Blanch Media 02/26/2020 2:27 PM
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
## 2020-02-26 NOTE — Progress Notes (Signed)
Inpatient Rehabilitation Medication Review by a Pharmacist  A complete drug regimen review was completed for this patient to identify any potential clinically significant medication issues.  Clinically significant medication issues were identified:  yes   Type of Medication Issue Identified Description of Issue Urgent (address now) Non-Urgent (address on AM team rounds) Plan Plan Accepted by Provider? (Yes / No / Pending AM Rounds)  Drug Interaction(s) (clinically significant)       Duplicate Therapy       Allergy       No Medication Administration End Date  Keppra for seizure prophylaxis is without end date. Was to be 7 days, started 8/24. Non-urgent Follow-up end date on AM rounds pending  Incorrect Dose       Additional Drug Therapy Needed       Other         Name of provider notified for urgent issues identified:   Provider Method of Notification: secure chat   For non-urgent medication issues to be resolved on team rounds tomorrow morning a [facility] Secure Chat Handoff was sent to:  P. Love. Will also have daytime pharmacist follow-up in AM.   Pharmacist comments: Notified PA of Keppra stop date (8/30)   Loura Back, PharmD, BCPS 4:50 PM  Dixie Dials, PharmD, BCPS 9:40 AM

## 2020-02-26 NOTE — Progress Notes (Signed)
Inpatient Rehabilitation  Patient information reviewed and entered into eRehab system by Karrina Lye M. Tiombe Tomeo, M.A., CCC/SLP, PPS Coordinator.  Information including medical coding, functional ability and quality indicators will be reviewed and updated through discharge.    
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
## 2020-02-27 ENCOUNTER — Inpatient Hospital Stay (HOSPITAL_COMMUNITY): Payer: Self-pay | Admitting: Physical Therapy

## 2020-02-27 ENCOUNTER — Inpatient Hospital Stay (HOSPITAL_COMMUNITY): Payer: Self-pay | Admitting: Occupational Therapy

## 2020-02-27 ENCOUNTER — Inpatient Hospital Stay (HOSPITAL_COMMUNITY): Payer: Self-pay

## 2020-02-27 MED ORDER — LORAZEPAM 0.5 MG PO TABS: 0.5000 mg | ORAL_TABLET | Freq: Four times a day (QID) | ORAL | Status: DC | PRN

## 2020-02-27 MED ORDER — ACETAMINOPHEN 325 MG PO TABS: 650.0000 mg | ORAL_TABLET | Freq: Three times a day (TID) | ORAL | Status: DC

## 2020-02-27 MED ORDER — LORAZEPAM 2 MG/ML IJ SOLN: 0.5000 mg | Freq: Four times a day (QID) | INTRAMUSCULAR | Status: DC | PRN

## 2020-02-27 NOTE — Progress Notes (Signed)
Occupational Therapy Session Note  Patient Details  Name: Mike Sexton MRN: [id number] Date of Birth: [birth year]  Today's Date: 02/27/2020 OT Individual Time: 0962-8366 OT Individual Time Calculation (min): 59 min    Short Term Goals: Week 1:  OT Short Term Goal 1 (Week 1): patient will roll in bed with CS, complete side lying to/from sitting with min A OT Short Term Goal 2 (Week 1): patient will tolerate sitting edge of bed for adl tasks with CS for balance OT Short Term Goal 3 (Week 1): patient will complete UB bathing and dressing with min A OT Short Term Goal 4 (Week 1): patient will complete LB bathing and dressing with mod A using ADs as needed OT Short Term Goal 5 (Week 1): patient will complete sit pivot transfer with mod A  Skilled Therapeutic Interventions/Progress Updates:    Pt completed supine to sit EOB with min assist to start session.  He reported increased pain in his upper back, neck, and head with nursing made aware.  Worked on sit to stand from the EOB with use of the Stedy to start.  He was able to complete sit to stand in the stedy with mod assist.  Increased trunk flexion with right knee flexion is noted.  Also worked on sit to stand with therapist in front of him at max progressing to mod assist.  Max facilitation in the right knee was needed to keep it from buckling. He completed stand pivot transfer to the wheelchair at total assist as well as standing and stepping two small steps up and down the EOB as well.  Mod assist was needed for transfer back to supine to complete session.  Pt's friend as well as video interpreter were present during session.  Pt left with bed alarm in place and call button and phone in reach.     Therapy Documentation Precautions:  Precautions Precautions: Cervical, Back, Fall Precaution Booklet Issued: No Required Braces or Orthoses: Cervical Brace, Spinal Brace Cervical Brace: At all times Spinal Brace:  (at all  times) Spinal Brace Comments: CTO Restrictions Weight Bearing Restrictions: No  Pain: Pain Assessment Pain Scale: PAINAD Pain Score: 8  Pain Type: Acute pain Pain Location: Head Pain Descriptors / Indicators: Discomfort;Headache Pain Onset: On-going Pain Intervention(s): Repositioned;Emotional support ADL: See Care Tool Section for some details of mobility and selfcare  Therapy/Group: Individual Therapy  Redding Cloe OTR/L 02/27/2020, 4:06 PM

## 2020-02-27 NOTE — Plan of Care (Signed)
Behavioral Plan   Pt currently demonstrates within functional limits for cognition & safety awareness. At this time no formal behavioral plan is needed. Safety plan in room appropriate at this time, please reference it per CIR policy. Safety plan does currently have recommendations of chair alarm & bed alarm.   Attendees:  Jake Shark, OTR/L Aleda Grana, PT, DPT Kennyth Arnold, RN 3

## 2020-02-27 NOTE — Progress Notes (Signed)
Physical Therapy Session Note  Patient Details  Name: Mike Sexton MRN: [id number] Date of Birth: Feb 25, 1991  Today's Date: 02/27/2020 PT Individual Time: 6160-7371 PT Individual Time Calculation (min): 56 min   Short Term Goals: Week 1:  PT Short Term Goal 1 (Week 1): Pt will complete bed mobility with bed flat with mod assist. PT Short Term Goal 2 (Week 1): Pt will complete bed<>w/c withouth mechanical lift with max assist +1. PT Short Term Goal 3 (Week 1): Pt will propel w/c 75 ft with supervision. PT Short Term Goal 4 (Week 1): Pt will complete sit<>stand with max assist +1.  Skilled Therapeutic Interventions/Progress Updates:  Professional interpreter used during session.  Pt received in bed & agreeable to tx. Supine>R sidelying with min assist & hospital bed features with max cuing throughout for log rolling, R sidelying>sitting with mod assist to bring BLE off of bed (pt unable to assist RLE off of bed with LLE) but min assist to upright trunk to sitting which is an improvement compared to yesterday. PT assists pt with donning pants sitting EOB/standing in stedy with cuing for compensatory technique. Assisted pt to w/c via stedy. In gym, pt transfers sit<>stand with RW & max assist with pt able to achieve standing but with significant anterior lean & PT instructs pt to return to w/c for safety. Sit<>stand in // bars with mod assist with cuing for safe hand placement for transfers & PT blocking R knee to prevent buckling & pt able to stand 3 times, for a max of 60 seconds at a time. W/c mobility with BUE & supervision around unit. Pt utilized kinetron in sitting for BLE strengthening & PT providing instructional cuing for activity. At end of session pt left in w/c with chair alarm donned, call bell in reach, visitor present in room.  PT requested pt have someone bring in tennis shoes in anticipation of trying RLE brace prior to d/c.   Therapy Documentation Precautions:   Precautions Precautions: Cervical, Back, Fall Required Braces or Orthoses: Cervical Brace, Spinal Brace Cervical Brace: At all times Spinal Brace:  (at all times) Spinal Brace Comments: CTO Restrictions Weight Bearing Restrictions: No  Pain: Pt c/o unrated HA, chest & back pain, rest breaks provided & pt reports he's premedicated   Therapy/Group: Individual Therapy  Sandi Mariscal 02/27/2020, 10:48 AM

## 2020-02-27 NOTE — Care Management (Signed)
Inpatient Rehabilitation Center Individual Statement of Services  Patient Name:  Mike Sexton  Date:  02/27/2020  Welcome to the Inpatient Rehabilitation Center.  Our goal is to provide you with an individualized program based on your diagnosis and situation, designed to meet your specific needs.  With this comprehensive rehabilitation program, you will be expected to participate in at least 3 hours of rehabilitation therapies Monday-Friday, with modified therapy programming on the weekends.  Your rehabilitation program will include the following services:  Physical Therapy (PT), Occupational Therapy (OT), Speech Therapy (ST), 24 hour per day rehabilitation nursing, Therapeutic Recreaction (TR), Psychology, Neuropsychology, Care Coordinator, Rehabilitation Medicine, Nutrition Services, Pharmacy Services and Other  Weekly team conferences will be held on Tuesdays to discuss your progress.  Your Inpatient Rehabilitation Care Coordinator will talk with you frequently to get your input and to update you on team discussions.  Team conferences with you and your family in attendance may also be held.  Expected length of stay: 3-4 weeks    Overall anticipated outcome: Minimal Assistance  Depending on your progress and recovery, your program may change. Your Inpatient Rehabilitation Care Coordinator will coordinate services and will keep you informed of any changes. Your Inpatient Rehabilitation Care Coordinator's name and contact numbers are listed  below.  The following services may also be recommended but are not provided by the Inpatient Rehabilitation Center:   Driving Evaluations  Home Health Rehabiltiation Services  Outpatient Rehabilitation Services  Vocational Rehabilitation   Arrangements will be made to provide these services after discharge if needed.  Arrangements include referral to agencies that provide these services.  Your insurance has been verified to be:   Uninsured  Your primary doctor is:  No PCP  Pertinent information will be shared with your doctor and your insurance company.  Inpatient Rehabilitation Care Coordinator:  Susie Cassette [phone] or ([phone]  Information discussed with and copy given to patient by: Gretchen Short, 02/27/2020, 10:18 AM
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
## 2020-02-27 NOTE — Progress Notes (Signed)
Pt admitted to 4W06 approx 1510. Transferred via bed side rails x 4. Oriented x 4, reviewed safety precautions and visitor/masking policy. Pt spanish speaking but able to understand some English. Aunt was at bedside when information was reviewed. Call bell placed in reach. Will cont to monitor.   Ross Ludwig, RN
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
## 2020-02-28 ENCOUNTER — Inpatient Hospital Stay (HOSPITAL_COMMUNITY): Payer: Self-pay

## 2020-02-28 ENCOUNTER — Ambulatory Visit (HOSPITAL_COMMUNITY): Payer: Self-pay | Admitting: *Deleted

## 2020-02-28 MED ORDER — ALPRAZOLAM 0.5 MG PO TABS: 0.5000 mg | ORAL_TABLET | Freq: Two times a day (BID) | ORAL | Status: DC | PRN
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
## 2020-02-28 NOTE — Progress Notes (Addendum)
Physical Therapy Session Note  Patient Details  Name: Mike Sexton MRN: [id number] Date of Birth: 1991-04-08  Today's Date: 02/28/2020 PT Individual Time: 0900-1000 PT Individual Time Calculation (min): 60 min   Short Term Goals: Week 1:  PT Short Term Goal 1 (Week 1): Pt will complete bed mobility with bed flat with mod assist. PT Short Term Goal 2 (Week 1): Pt will complete bed<>w/c withouth mechanical lift with max assist +1. PT Short Term Goal 3 (Week 1): Pt will propel w/c 75 ft with supervision. PT Short Term Goal 4 (Week 1): Pt will complete sit<>stand with max assist +1.  Skilled Therapeutic Interventions/Progress Updates:     Patient in bed with CTO donned and his cousin in the room upon PT arrival. Patient alert and agreeable to PT session. Patient reported 4-5/10 neck and back pain during session, RN provided pain medicine prior to session. PT provided repositioning, rest breaks, and distraction as pain interventions throughout session. Live interpreter not present, utilized [employer] interpretation system throughout session.   Assessed CTO with patient sitting EOB with supervision for sitting balance. ABD pads in place at mandibles, applied a wash cloth over occiput supports due to hard plastic causing discomfort for the patient. Brace placed and fit appropriately, no adjustment required. Patient stated that it feels better with the padding, just caused pressure due to feeling tight. PT educated on spinal precautions and risk of injury if the collar is loose and precautions are not followed. Patient stated understanding and in agreement.   Therapeutic Activity: Bed Mobility: Patient performed supine to sit with CGA-supervision with use of bed rail. Provided verbal cues performing log rolling technique to maintain spinal precautions, sliding his R LE off the bed using his L, and setting his bottom elbow to push to sit up. Donned tennis shoes with total A for time  management and energy conservation with patient sitting EOB with supervision for sitting balance Transfers: Patient performed squat pivot with PT blocking R knee with min A bed>w/c and sit to/from stand x3 blocking R knee with patient pushing up from the arm rest of the w/c and holding onto the sink with min A. Provided verbal cues and facilitation for forward weight shift due to mild posterior bias, and increased hip and knee extension R>L.  Wheelchair Mobility:  Patient propelled wheelchair 45 feet using B UEs with significantly decreased speed and supervison. Provided verbal cues for equal B propulsion for improved steering and increased momentum with each stroke.  Neuromuscular Re-ed: Patient performed the following standing balance activities: -static standing x1 min 48 sec -standing weight shifts x1 min -standing mini squats x2 before R knee buckled resulting in patient spontaneously returning to sitting with mod A to control descent  Patient in w/c in the room with CTO donned and his cousin in the room at end of session with breaks locked, seat belt alarm set, and all needs within reach.    Therapy Documentation Precautions:  Precautions Precautions: Cervical, Back, Fall Precaution Booklet Issued: No Required Braces or Orthoses: Cervical Brace, Spinal Brace Cervical Brace: At all times Spinal Brace:  (at all times) Spinal Brace Comments: CTO Restrictions Weight Bearing Restrictions: No   Therapy/Group: Individual Therapy  Chrislyn Seedorf L Koa Palla PT, DPT  02/28/2020, 4:15 PM
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
## 2020-02-28 NOTE — Progress Notes (Signed)
Physical Therapy Session Note  Patient Details  Name: Mike Sexton MRN: [id number] Date of Birth: [birth year]  Today's Date: 02/28/2020 PT Individual Time: 1102-1200 PT Individual Time Calculation (min): 58 min   Short Term Goals: Week 1:  PT Short Term Goal 1 (Week 1): Pt will complete bed mobility with bed flat with mod assist. PT Short Term Goal 2 (Week 1): Pt will complete bed<>w/c withouth mechanical lift with max assist +1. PT Short Term Goal 3 (Week 1): Pt will propel w/c 75 ft with supervision. PT Short Term Goal 4 (Week 1): Pt will complete sit<>stand with max assist +1.  Skilled Therapeutic Interventions/Progress Updates:     Spanish Interpreter Jenine provides spanish translation for session.  Pt received seated in WC and agreeable to therapy. Reports pain in bilateral chest and back, as well as face and back of head. PT notes scab on occiput of pt's head at point of contact with aspen brace. Washcloth positioned to cushion area.   WC transport to gym for time management. Rec therapist present for 1st half of session. Pt performs multiple reps of sit to stand in // bars. Pt requires minA and blocking of R knee, with pt using significant amount of upper body strength to complete. PT cues pt to attempt using less arm strength to complete transfer and pt is unable to complete sit to stand, with both legs buckling on attempt. In standing, PT blocks pt's R knee and pt performs lateral weight shifting with verbal and tactile cues to encourage weight shift to R. Pt is hesitant to WB significantly through RLE due to weakness and impaired sensation. Pt's RLE also has tendency to adduct in open chain activities. Pt ambulates x3' with PT manually assisting RLE for progression and blocking R knee during stance phase. Pt requires modA +1 for ambulation with +2 WC follow for safety.   WC transport back to room. Lateral transfer back to bed with modA. Sit to supine with miNA  management of BLEs. Left supine in bed with alarm intact and all needs within reach.  Therapy Documentation Precautions:  Precautions Precautions: Cervical, Back, Fall Precaution Booklet Issued: No Required Braces or Orthoses: Cervical Brace, Spinal Brace Cervical Brace: At all times Spinal Brace:  (at all times) Spinal Brace Comments: CTO Restrictions Weight Bearing Restrictions: No    Therapy/Group: Individual Therapy  Beau Fanny, PT, DPT 02/28/2020, 4:10 PM
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
## 2020-02-28 NOTE — Progress Notes (Signed)
Occupational Therapy Session Note  Patient Details  Name: Mike Sexton MRN: [id number] Date of Birth: 1991-06-09  Today's Date: 02/28/2020 OT Individual Time: 1345-1500 OT Individual Time Calculation (min): 75 min    Short Term Goals: Week 1:  OT Short Term Goal 1 (Week 1): patient will roll in bed with CS, complete side lying to/from sitting with min A OT Short Term Goal 2 (Week 1): patient will tolerate sitting edge of bed for adl tasks with CS for balance OT Short Term Goal 3 (Week 1): patient will complete UB bathing and dressing with min A OT Short Term Goal 4 (Week 1): patient will complete LB bathing and dressing with mod A using ADs as needed OT Short Term Goal 5 (Week 1): patient will complete sit pivot transfer with mod A  Skilled Therapeutic Interventions/Progress Updates:    1:1. Interpreter present and pt reporting unrated pain in neck, however RN delivers meds at beginning of session. OT uses squat pivot with VC for hand placement to L with Min A and stedy with MIN A for transfers to EOB<>w/c or EOM. Pt able to swing RLE and kick soccer ball 2x20 with increased time and encouragement for timing and activation of RLE in prep for functional transfers. Pt completes seated dynavision task requring 1.07 reaction time with pt reporting vertical diplopia of lights. Pt provided with glasses temoporarily with 1 eye occluded and pt able to see single. With visual scanning activity pt demo decreased horizontal tracking R with R eye demo difficulty with exotropia. Educated pt based on finding before returning to bed, exit alarm on and call light in reach  Therapy Documentation Precautions:  Precautions Precautions: Cervical, Back, Fall Precaution Booklet Issued: No Required Braces or Orthoses: Cervical Brace, Spinal Brace Cervical Brace: At all times Spinal Brace:  (at all times) Spinal Brace Comments: CTO Restrictions Weight Bearing Restrictions: No General:   Vital  Signs:   Pain:   ADL: ADL Eating: Set up Where Assessed-Eating: Bed level Grooming: Moderate assistance Where Assessed-Grooming: Bed level Upper Body Bathing: Maximal assistance Where Assessed-Upper Body Bathing: Edge of bed Lower Body Bathing: Maximal assistance Where Assessed-Lower Body Bathing: Bed level Upper Body Dressing: Maximal assistance Where Assessed-Upper Body Dressing: Edge of bed Lower Body Dressing: Maximal assistance Where Assessed-Lower Body Dressing: Bed level Toileting: Dependent Vision   Perception    Praxis   Exercises:   Other Treatments:     Therapy/Group: Individual Therapy  Shon Hale 02/28/2020, 1:49 PM

## 2020-02-29 ENCOUNTER — Inpatient Hospital Stay (HOSPITAL_COMMUNITY): Payer: Self-pay

## 2020-02-29 ENCOUNTER — Inpatient Hospital Stay (HOSPITAL_COMMUNITY): Payer: Self-pay | Admitting: Physical Therapy

## 2020-02-29 DIAGNOSIS — R29898 Other symptoms and signs involving the musculoskeletal system: Secondary | ICD-10-CM

## 2020-02-29 LAB — GLUCOSE, CAPILLARY: Glucose-Capillary: 103 mg/dL — ABNORMAL HIGH (ref 70–99)

## 2020-02-29 MED ORDER — MORPHINE SULFATE ER 15 MG PO TBCR
15.0000 mg | EXTENDED_RELEASE_TABLET | Freq: Two times a day (BID) | ORAL | Status: DC
  Administered 2020-02-29 – 2020-03-18 (×36): 15 mg via ORAL
  Filled 2020-02-29 (×37): qty 1

## 2020-02-29 NOTE — Progress Notes (Addendum)
Physical Therapy Session Note  Patient Details  Name: Mike Sexton MRN: [id number] Date of Birth: 1991-03-02  Today's Date: 02/29/2020 PT Individual Time: 1107-1202 PT Individual Time Calculation (min): 55 min   Short Term Goals: Week 1:  PT Short Term Goal 1 (Week 1): Pt will complete bed mobility with bed flat with mod assist. PT Short Term Goal 2 (Week 1): Pt will complete bed<>w/c withouth mechanical lift with max assist +1. PT Short Term Goal 3 (Week 1): Pt will propel w/c 75 ft with supervision. PT Short Term Goal 4 (Week 1): Pt will complete sit<>stand with max assist +1.  Skilled Therapeutic Interventions/Progress Updates:  Professional interpreter present for session. Pt received in bed & agreeable to tx. Supine>R sidelying with CGA & cuing for technique, R sidelying>sitting with min assist, sit>sidelying with mod assist for BLE. Squat pivot bed<>w/c, w/c<>mat table throughout session with mod assist & PT blocking R knee. PT dons R GRAFO total assist (doffed at end of session with no redness noted) & educated pt on purpose of brace. Sit<>stand from EOM with mod assist +2 with PT approximating R knee to prevent buckling & providing instructional cuing for hand placement. Pt able to progress to taking 2-3 steps forwards/backwards with RW & mod assist +2. Pt does experience BLE knee buckling but is able to correct with assistance. Pt then progressed to ambulating 8 ft with RW & PT assisting with RLE placement. Pt with slightly more RLE hip flexion noted as he was able to activate RLE and initiate forward movement during gait. Pt propels w/c gym>room with BUE & supervision. At end of session pt left in bed with alarm set, call bell & all needs in reach, visitor present in room.  Therapy Documentation Precautions:  Precautions Precautions: Cervical, Back, Fall Precaution Booklet Issued: No Required Braces or Orthoses: Cervical Brace, Spinal Brace Cervical Brace: At all  times Spinal Brace:  (at all times) Spinal Brace Comments: CTO Restrictions Weight Bearing Restrictions: No  Pain: C/o global pain but states he's premedicated & rest breaks provided PRN.  Therapy/Group: Individual Therapy  Sandi Mariscal 02/29/2020, 12:16 PM
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
## 2020-02-29 NOTE — Progress Notes (Signed)
Physical Therapy Session Note  Patient Details  Name: Mike Sexton MRN: [id number] Date of Birth: 06/21/1991  Today's Date: 02/29/2020 PT Individual Time: 3662-9476 PT Individual Time Calculation (min): 69 min   Short Term Goals: Week 1:  PT Short Term Goal 1 (Week 1): Pt will complete bed mobility with bed flat with mod assist. PT Short Term Goal 2 (Week 1): Pt will complete bed<>w/c withouth mechanical lift with max assist +1. PT Short Term Goal 3 (Week 1): Pt will propel w/c 75 ft with supervision. PT Short Term Goal 4 (Week 1): Pt will complete sit<>stand with max assist +1.  Skilled Therapeutic Interventions/Progress Updates: Pt presents semi-reclined in bed w/ Orthotist present fitting for PRAFO to R foot.  Also educated on unlocking CTO during bending and moving, but once up, can be locked again.   Pt c/o pain to head and requesting pain meds.  Nursing notified and brought meds.  Pt performed LE there ex for pain meds to take effect.  Pt performed GS, HS, abd/add and AP (with AAROM for RLE).  Pt states not painful, but because of numbness to lateral thigh/hip, "it feels weird" through interpreter.  Pt performed log roll w/ min to CGA and cueing for hooklying position to roll to right.  Pt required min A for side-lying to sit and cues for use of LLE for RLE.  Pt sat EOB for total A to don shoes.  Pt performed lateral scoot bed> w/cw/ mod A and verbal cues for sequencing and safety.  AFO applied in sitting.  Pt wheeled to gym for time and energy conservation.  Pt performed sit to stand transfers w/ Mod A and split hand placement.  Pt able to amb multiple trials w/ RW and mod A, mostly for placement of RLE, as able to advance LE.  1 Episode of R knee buckling during gait.  Pt amb in room to bed including turns to approach w/ same assist.  Pt amb up to 8'.  Pt required mod A for sit to right sidelying although pt attempts to turn and bring LE up to bed.  Educated pt through  interpreter to complete log roll to sidelying before attempting turn to supine.  Bed alarm on and all needs in reach.  Cousin present and remains in room.     Therapy Documentation Precautions:  Precautions Precautions: Cervical, Back, Fall Precaution Booklet Issued: No Required Braces or Orthoses: Cervical Brace, Spinal Brace Cervical Brace: At all times Spinal Brace:  (at all times) Spinal Brace Comments: CTO Restrictions Weight Bearing Restrictions: No General:   Vital Signs: Therapy Vitals Temp: 97.6 F (36.4 C) Pulse Rate: 74 Resp: 18 BP: (!) 111/57 Patient Position (if appropriate): Lying Oxygen Therapy SpO2: 100 % O2 Device: Room Air Pain: 8-9/10 in head, nursing notified and brought pain meds.      Therapy/Group: Individual Therapy  [name] 02/29/2020, 3:30 PM
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
## 2020-02-29 NOTE — Progress Notes (Signed)
Occupational Therapy Session Note  Patient Details  Name: Mike Sexton MRN: [id number] Date of Birth: 1990-09-11  Today's Date: 02/29/2020 OT Individual Time: 7619-5093 OT Individual Time Calculation (min): 55 min    Short Term Goals: Week 1:  OT Short Term Goal 1 (Week 1): patient will roll in bed with CS, complete side lying to/from sitting with min A OT Short Term Goal 2 (Week 1): patient will tolerate sitting edge of bed for adl tasks with CS for balance OT Short Term Goal 3 (Week 1): patient will complete UB bathing and dressing with min A OT Short Term Goal 4 (Week 1): patient will complete LB bathing and dressing with mod A using ADs as needed OT Short Term Goal 5 (Week 1): patient will complete sit pivot transfer with mod A  Skilled Therapeutic Interventions/Progress Updates:    1;1. Pt received inbed with interpreter and friend present. Pt agreeable to OT despite premedicated neck pain from brace 7/10. Repositioning and rest provided. tp completes supine>sitting with MAX A and squat pivot transfer EOB<>w/c with MOD A and R knee block. Pt completes UB bathing at sink with MIN A for thoroughness around CTO and washing back. Pt dons gown with MIN A overall to tie in back. OT reviews reacher and sock aide. Pt requires min VC and MIN A to doff and don B socks (A only for RLE) and coaching cues after demonstration of AE. Discussed LHSS to wash BLE during bathing d/t pt unable to achieve figure 4 d/t B hip tightness. Exited session with pt seated in bed, exit alarm on and call light tin reach  Therapy Documentation Precautions:  Precautions Precautions: Cervical, Back, Fall Precaution Booklet Issued: No Required Braces or Orthoses: Cervical Brace, Spinal Brace Cervical Brace: At all times Spinal Brace:  (at all times) Spinal Brace Comments: CTO Restrictions Weight Bearing Restrictions: No General:   Vital Signs: Therapy Vitals Temp: 98.4 F (36.9 C) Pulse Rate:  62 Resp: 16 BP: 102/64 Patient Position (if appropriate): Lying Oxygen Therapy SpO2: 99 % O2 Device: Room Air Pain:   ADL: ADL Eating: Set up Where Assessed-Eating: Bed level Grooming: Moderate assistance Where Assessed-Grooming: Bed level Upper Body Bathing: Maximal assistance Where Assessed-Upper Body Bathing: Edge of bed Lower Body Bathing: Maximal assistance Where Assessed-Lower Body Bathing: Bed level Upper Body Dressing: Maximal assistance Where Assessed-Upper Body Dressing: Edge of bed Lower Body Dressing: Maximal assistance Where Assessed-Lower Body Dressing: Bed level Toileting: Dependent Vision   Perception    Praxis   Exercises:   Other Treatments:     Therapy/Group: Individual Therapy  Shon Hale 02/29/2020, 7:08 AM

## 2020-03-01 ENCOUNTER — Inpatient Hospital Stay (HOSPITAL_COMMUNITY): Payer: Self-pay | Admitting: Physical Therapy

## 2020-03-01 ENCOUNTER — Inpatient Hospital Stay (HOSPITAL_COMMUNITY): Payer: Self-pay

## 2020-03-01 MED ORDER — SORBITOL 70 % SOLN
30.0000 mL | Freq: Once | Status: AC
  Administered 2020-03-01: 30 mL via ORAL
  Filled 2020-03-01: qty 30

## 2020-03-01 NOTE — Progress Notes (Signed)
Occupational Therapy Session Note  Patient Details  Name: Mike Sexton MRN: [id number] Date of Birth: 1991/02/20  Today's Date: 03/01/2020 OT Individual Time: 0900-1000 OT Individual Time Calculation (min): 60 min    Short Term Goals: Week 1:  OT Short Term Goal 1 (Week 1): patient will roll in bed with CS, complete side lying to/from sitting with min A OT Short Term Goal 2 (Week 1): patient will tolerate sitting edge of bed for adl tasks with CS for balance OT Short Term Goal 3 (Week 1): patient will complete UB bathing and dressing with min A OT Short Term Goal 4 (Week 1): patient will complete LB bathing and dressing with mod A using ADs as needed OT Short Term Goal 5 (Week 1): patient will complete sit pivot transfer with mod A  Skilled Therapeutic Interventions/Progress Updates:    1:1. Pt received in bed agreeable to OT. Pt completes squat pivot transfers  throughotu session with MIN A to L and MOD A to R into/out of bed/w/c. Pt completes UB bathing at sink with S and MIN A to fasten gown behind back. Total A to don shoes. Pt completes 4x5 mini squats in standing frame with 2 rounds of prolonged stands for ~5 min each for weight bearing and deep proprioceptive input for NMR/strengthening required in BLE for functional transfers and ADLs. Pt completes 3x30 ball toss (chest bounce and head height pass) with 1.5 # wrist weights for actiivyt tolerance required for BADls. Exited session with pt seated in bed, exit alarm on and call light itn reach  Therapy Documentation Precautions:  Precautions Precautions: Cervical, Back, Fall Precaution Booklet Issued: No Required Braces or Orthoses: Cervical Brace, Spinal Brace Cervical Brace: At all times Spinal Brace:  (at all times) Spinal Brace Comments: CTO Restrictions Weight Bearing Restrictions: No General:   Vital Signs: Therapy Vitals Temp: 97.9 F (36.6 C) Pulse Rate: 66 Resp: 18 BP: 107/61 Patient Position (if  appropriate): Lying Oxygen Therapy SpO2: 100 % Pain: Pain Assessment Pain Scale: 0-10 Pain Score: 6  ADL: ADL Eating: Set up Where Assessed-Eating: Bed level Grooming: Moderate assistance Where Assessed-Grooming: Bed level Upper Body Bathing: Maximal assistance Where Assessed-Upper Body Bathing: Edge of bed Lower Body Bathing: Maximal assistance Where Assessed-Lower Body Bathing: Bed level Upper Body Dressing: Maximal assistance Where Assessed-Upper Body Dressing: Edge of bed Lower Body Dressing: Maximal assistance Where Assessed-Lower Body Dressing: Bed level Toileting: Dependent Vision   Perception    Praxis   Exercises:   Other Treatments:     Therapy/Group: Individual Therapy  Shon Hale 03/01/2020, 9:04 AM
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
## 2020-03-01 NOTE — Progress Notes (Addendum)
Physical Therapy Session Note  Patient Details  Name: Mike Sexton MRN: [id number] Date of Birth: 09-20-1990  Today's Date: 03/01/2020 PT Individual Time: 1100-1210 and 5427-0623 PT Individual Time Calculation (min): 70 min and 77 min  Short Term Goals: Week 1:  PT Short Term Goal 1 (Week 1): Pt will complete bed mobility with bed flat with mod assist. PT Short Term Goal 2 (Week 1): Pt will complete bed<>w/c withouth mechanical lift with max assist +1. PT Short Term Goal 3 (Week 1): Pt will propel w/c 75 ft with supervision. PT Short Term Goal 4 (Week 1): Pt will complete sit<>stand with max assist +1.  Skilled Therapeutic Interventions/Progress Updates:  Treatment 1: Pt received in bed. Girlfriend present with mask off & PT educates her on masking policy & she dons it. Pt completes bed mobility with supervision & hospital bed features, log rolling without cuing & able to use LLE to push RLE off of bed. PT dons B tennis shoes & R AFO total assist. Discussed need for pt to lock/unlock brace when transferring sit<>standing to allow brace to lengthen/shorten based on pt's position. Pt completes squat pivot to L with min assist with PT blocking R knee. W/c mobility room>gym & around unit with BUE & supervision. Sit<>stand with min assist & RW. Gait x 10 ft + 4 ft with RW & mod assist + w/c follow for safety with PT assisting with RLE placement, cuing for RLE quad activation in stance phase before stepping with LLE, cuing/assist with RW management & sequencing of AD with gait pattern. Pt does require seated rest breaks 2/2 BLE fatigue. Pt engages in standing with RW & min/mod assist while reaching for horseshoes with focus on R knee extension in standing & balance with pt experiencing multiple BLE buckling & rest breaks PRN 2/2 BLE fatigue. BUE ergometer up to level 3.5 x 5 minutes forwards + 5 minutes backwards for BUE strengthening & cardiopulmonary endurance training. Pt encouraged to sit  up in w/c but requesting to go back to bed 2/2 fatigue. Squat pivot w/c>bed with min/mod assist, sit>supine with mod assist to assist BLE onto bed. Pt left in bed with alarm set, call bell in reach, visitor present in room. Pain: 7/10 HA but states he's premedicated   Treatment 2: Pt received in bed & agreeable to tx. Supine>R sidelying>sitting EOB with supervision & hospital bed features. PT dons B shoes & R GRAFO total assist. Squat pivot bed>w/c on L with min assist. W/c mobility with BUE & supervision. Sit>stand with min assist, gait x 10 ft + 6 ft with RW & mod assist + w/c follow for safety with PT providing cuing for gait pattern with AD with pt demonstrating alternating step-to & step through pattern. PT provides approximation at L knee but during first trial pt experiences BLE knee buckling & requires +2 assist to safely sit in w/c. Standing at high/low table with min/mod assist, LUE support on table, PT approximating at R knee, pt stands x 3 trials all <1 minute with cuing for increased knee extension RLE & focus on standing tolerance & BLE strengthening. Pt then performs LLE long arc quads 3 sets x 15 reps with 5# ankle weight for strengthening with PT providing instructional cuing for technique. Pt engages in ball taps with 3# weighted bar (educated pt on lifting restrictions 2/2 back precautions) to focus on BUE strengthening & endurance training; pt is able to perform task 1.5 minutes + 1.5 minutes + 1 minute 40  seconds with rest breaks in between. W/c<>nu-step via squat pivot with min assist. Pt utilized nu-step on level 3 x 12 minutes with all four extremities with focus on global strengthening & endurance training & coordination of reciprocal movements. Pt does require use of LE attachment to maintain RLE neutral alignment after 5 minutes of activity. At end of session pt left in w/c with chair alarm donned, call bell in reach, girlfriend in room. Pain: c/o pressure from brace with nu-step but  pt does not report this until end of activity  Therapy Documentation Precautions:  Precautions Precautions: Cervical, Back, Fall Precaution Booklet Issued: No Required Braces or Orthoses: Cervical Brace, Spinal Brace Cervical Brace: At all times Spinal Brace:  (at all times) Spinal Brace Comments: CTO Restrictions Weight Bearing Restrictions: No    Therapy/Group: Individual Therapy  Sandi Mariscal 03/01/2020, 3:20 PM
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
## 2020-03-03 ENCOUNTER — Inpatient Hospital Stay (HOSPITAL_COMMUNITY): Payer: Self-pay | Admitting: Physical Therapy

## 2020-03-03 ENCOUNTER — Inpatient Hospital Stay (HOSPITAL_COMMUNITY): Payer: Self-pay

## 2020-03-03 LAB — BASIC METABOLIC PANEL
Anion gap: 10 (ref 5–15)
BUN: 9 mg/dL (ref 6–20)
CO2: 27 mmol/L (ref 22–32)
Calcium: 9.3 mg/dL (ref 8.9–10.3)
Chloride: 101 mmol/L (ref 98–111)
Creatinine, Ser: 0.7 mg/dL (ref 0.61–1.24)
GFR calc Af Amer: >60 mL/min (ref >60)
GFR calc non Af Amer: >60 mL/min (ref >60)
Glucose, Bld: 105 mg/dL — ABNORMAL HIGH (ref 70–99)
Potassium: 4.3 mmol/L (ref 3.5–5.1)
Sodium: 138 mmol/L (ref 135–145)

## 2020-03-03 NOTE — Progress Notes (Signed)
Physical Therapy Session Note  Patient Details  Name: Mike Sexton MRN: [id number] Date of Birth: Dec 15, 1990  Today's Date: 03/03/2020 PT Individual Time: 1101-1156 PT Individual Time Calculation (min): 55 min   Short Term Goals: Week 1:  PT Short Term Goal 1 (Week 1): Pt will complete bed mobility with bed flat with mod assist. PT Short Term Goal 2 (Week 1): Pt will complete bed<>w/c withouth mechanical lift with max assist +1. PT Short Term Goal 3 (Week 1): Pt will propel w/c 75 ft with supervision. PT Short Term Goal 4 (Week 1): Pt will complete sit<>stand with max assist +1.  Skilled Therapeutic Interventions/Progress Updates:  Pt received in w/c & agreeable to tx. W/c mobility room>gym with BUE & supervision with 1 rest break 2/2 fatigue. PT dons R GRAFO total assist. Squat pivot w/c>EOM with min assist. Pt transfers sit<>stand with RW & min assist with cuing for hand placement. Pt performs standing marches with BUE support on RW & PT approximating stabilizing knee to prevent buckling. Pt performed standing target taps with focus on activating RLE quads in stance phase, BLE coordination & strengthening (pt uses mirror to see targets as he's unable to look down 2/2 CTO). Gait x 6 ft + 9 ft + 12 ft with RW & min/mod assist + very close w/c follow with pt requiring occasional assistance for RLE placement, PT approximating R knee to prevent buckling (in addition to use of GRAFO), cuing for R quad activation in stance phase to advance LLE & rest breaks PRN 2/2 BLE fatigue. Pt propels w/c with BLE x 30 ft with cuing for technique & safety with task focusing on BLE strengthening. Back in room, pt transfers w/c>bed with min assist squat pivot, sit>supine via log rolling with min assist for RLE & hospital bed features. Pt left in bed with alarm set & call bell & all needs in reach, visitor present in room.  Interpreter present for session.  Therapy Documentation Precautions:   Precautions Precautions: Cervical, Back, Fall Precaution Booklet Issued: No Required Braces or Orthoses: Cervical Brace, Spinal Brace Cervical Brace: At all times Spinal Brace:  (at all times) Spinal Brace Comments: CTO Restrictions Weight Bearing Restrictions: No  Pain: 5-6/10 HA & R sided chest pain underneath pain patch when he stretches/moves around (pt reports this pain began on Saturday) - nurse made aware & rest breaks provided PRN   Therapy/Group: Individual Therapy  Sandi Mariscal 03/03/2020, 12:00 PM

## 2020-03-03 NOTE — Progress Notes (Signed)
Occupational Therapy Session Note  Patient Details  Name: Mike Sexton MRN: [id number] Date of Birth: 12/10/1990  Today's Date: 03/03/2020 OT Individual Time: 3888-2800 OT Individual Time Calculation (min): 56 min    Short Term Goals: Week 1:  OT Short Term Goal 1 (Week 1): patient will roll in bed with CS, complete side lying to/from sitting with min A OT Short Term Goal 2 (Week 1): patient will tolerate sitting edge of bed for adl tasks with CS for balance OT Short Term Goal 3 (Week 1): patient will complete UB bathing and dressing with min A OT Short Term Goal 4 (Week 1): patient will complete LB bathing and dressing with mod A using ADs as needed OT Short Term Goal 5 (Week 1): patient will complete sit pivot transfer with mod A  Skilled Therapeutic Interventions/Progress Updates:    1:1. Pt received in bed agreeable to OT. Pt completes supine>sitting with hospital bed features and VC for log roll and LE compensatory techniques. Pt with improved RLE activation this date and able to manage LE off the bed without A. Pt completes MIN A squat pivot transfer OOB to w/c with R knee block and no VC for hand placements. Pt bathes UB with S at sink and dons new gown. Sit to stand at sink with MOD A and OT provides MOD A to advance pants past hips. Pt completes standing balance activity at high low table with CGA and R knee block while completing pipe tree activity with VC for decreased head turning/twisting during activity, R knee extension and weight shifting R. Pt able to complete 2 pipe tree figures with 1 seated rest break. Exited sessionw ihtpt seated in w/c exit alarm on and call light in reach. Interpreter present throughout session. No pain reported just "numbness" in BLE   Therapy Documentation Precautions:  Precautions Precautions: Cervical, Back, Fall Precaution Booklet Issued: No Required Braces or Orthoses: Cervical Brace, Spinal Brace Cervical Brace: At all  times Spinal Brace:  (at all times) Spinal Brace Comments: CTO Restrictions Weight Bearing Restrictions: No General:   Vital Signs: Therapy Vitals Temp: 97.8 F (36.6 C) Pulse Rate: (!) 59 Resp: 15 BP: 101/60 Patient Position (if appropriate): Lying Oxygen Therapy O2 Device: Room Air Pain:   ADL: ADL Eating: Set up Where Assessed-Eating: Bed level Grooming: Moderate assistance Where Assessed-Grooming: Bed level Upper Body Bathing: Maximal assistance Where Assessed-Upper Body Bathing: Edge of bed Lower Body Bathing: Maximal assistance Where Assessed-Lower Body Bathing: Bed level Upper Body Dressing: Maximal assistance Where Assessed-Upper Body Dressing: Edge of bed Lower Body Dressing: Maximal assistance Where Assessed-Lower Body Dressing: Bed level Toileting: Dependent Vision   Perception    Praxis   Exercises:   Other Treatments:     Therapy/Group: Individual Therapy  Shon Hale 03/03/2020, 7:09 AM
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
## 2020-03-03 NOTE — Progress Notes (Signed)
Physical Therapy Session Note  Patient Details  Name: Mike Sexton MRN: [id number] Date of Birth: October 10, 1990  Today's Date: 03/03/2020 PT Individual Time: 7253-6644 PT Individual Time Calculation (min): 71 min   Short Term Goals: Week 1:  PT Short Term Goal 1 (Week 1): Pt will complete bed mobility with bed flat with mod assist. PT Short Term Goal 2 (Week 1): Pt will complete bed<>w/c withouth mechanical lift with max assist +1. PT Short Term Goal 3 (Week 1): Pt will propel w/c 75 ft with supervision. PT Short Term Goal 4 (Week 1): Pt will complete sit<>stand with max assist +1.  Skilled Therapeutic Interventions/Progress Updates: Pt presents supine in bed, but agreeable to therapy.  Pt performed log roll to right w/ verbal cues for left knee flexion and side rails, then supervision to sit, although cues for breathing.  Pt able to move RLE off the bed today.  Pt required total A to don shoes and R AFO, although helping to push into shoe.  Pt transferred squat pivot w/ min A, bed > w/c.  Wheeled pt to gym for time and energy conservation.  Pt performed UBE at Level 2 x 5', then level 3 x 5' w/o trunk support and cueing through interpreter for trunk/core stabilization.  Pt required 1' rest between trials.  Pt performed standing Dynavision in upper quadrants w/ cueing for alternating hands while maintaining balance/use of RLE.  Verbal cues for speed and safety w/ 1 LOB to right requiring PT assist to regain.  Pt performed multiple trials of gait up to 10' w/ min to CGA for safety, but blue T-band used for assist w/ placing R foot for weight-bearing (I.e. improved abduction, step length).  Pt required seated rest breaks 2/2 fatigue.  Pt requires verbal cues for increased run of RW.  Pt returned to room and amb approx. 6' to bed, including turn to approach bed.  Min A for sit to sidelying.  Bed alarm on and all needs in reach.  Interpreter present during entire session.     Therapy  Documentation Precautions:  Precautions Precautions: Cervical, Back, Fall Precaution Booklet Issued: No Required Braces or Orthoses: Cervical Brace, Spinal Brace Cervical Brace: At all times Spinal Brace:  (at all times) Spinal Brace Comments: CTO Restrictions Weight Bearing Restrictions: No General:   Vital Signs:  Pain:6/10 frontal skull Pain Assessment Pain Scale: 0-10 Pain Score: 4  Pain Type: Acute pain Pain Location: Neck Pain Orientation: Posterior Pain Radiating Towards: chest and head Pain Descriptors / Indicators: Aching;Grimacing Pain Frequency: Constant Pain Onset: On-going Pain Intervention(s): Medication (See eMAR)    Therapy/Group: Individual Therapy  [name] 03/03/2020, 3:21 PM

## 2020-03-04 ENCOUNTER — Inpatient Hospital Stay (HOSPITAL_COMMUNITY): Payer: Self-pay

## 2020-03-04 ENCOUNTER — Inpatient Hospital Stay (HOSPITAL_COMMUNITY): Payer: Self-pay | Admitting: Physical Therapy

## 2020-03-04 MED ORDER — SENNOSIDES-DOCUSATE SODIUM 8.6-50 MG PO TABS
2.0000 | ORAL_TABLET | Freq: Every day | ORAL | Status: DC
  Administered 2020-03-04 – 2020-03-20 (×17): 2 via ORAL
  Filled 2020-03-04 (×17): qty 2
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
## 2020-03-04 NOTE — Progress Notes (Signed)
Patient ID: Mike Sexton, male   DOB: 12/12/1990, 29 y.o.   MRN: [id number]  SW met with pt and pt sister Mike Sexton in room to provide updates from team conference, and d/c date 9/24. SW discussed DME and charity process. Family would like charity through [employer]. Family education scheduled for 9/17 8am-12pm and 9/20-9/21 1pm-3pm with pt sister Mike Sexton and mother Mike Sexton. SW informed once aware of all DME, will provide updates.   Loralee Pacas, MSW, [place] Office: [phone] Cell: [phone] Fax: [phone]

## 2020-03-04 NOTE — Progress Notes (Signed)
Physical Therapy Session Note  Patient Details  Name: Mike Sexton MRN: [id number] Date of Birth: 12-05-1990  Today's Date: 03/04/2020 PT Individual Time: 8099-8338 and 2505-3976 PT Individual Time Calculation (min): 68 min and 70 min  Short Term Goals: Week 2:  PT Short Term Goal 1 (Week 2): Pt will intiate stair training with max assist +1. PT Short Term Goal 2 (Week 2): Pt will complete car transfer with mod assist +1. PT Short Term Goal 3 (Week 2): Pt will ambulate 50 ft with LRAD & mod assist +1.  Skilled Therapeutic Interventions/Progress Updates:  Treatment 1: Pt received in bed & agreeable to tx. Bed mobility with hospital bed features & supervision. PT dons shoes & R GRAFO total assist, squat pivot bed>w/c on L with min assist. W/c mobility room<>gym with BUE & supervision. Sit<>stand with min assist with RW & gait x 12 ft + 10 ft with RW & min assist + w/c close follow for safety. PT provides assistance for RLE placement (pt with narrow step width RLE), cuing for quad activation RLE while advancing LLE, cuing for sequencing. During 2nd gait trial pt does experience BLE knee buckling & requires assistance to safely sit in w/c. PT dons lite gait harness (pt sit<>stand in stedy) total assist. Pt requires +2 assist to step up/down onto treadmill. Gait training on treadmill x 63 ft + 75 ft with PT providing cuing for sequencing gait pattern, R quad activation in stance phase, & upright posture. Pt reports the harness does not interfere with CTO, as harness is positioned low on pt's trunk/hips. At end of session pt left in w/c with chair alarm donned, call bell in reach, visitor present in room. Pain: pt c/o unrated HA but states he's premedicated  Treatment 2: Pt received in stedy in handoff from NT after using restroom. Pt completes hand hygiene while sitting in stedy with supervision then transfers to w/c. PT dons B shoes & R GRAFO total assist for time management. Pt notes  fatigue at beginning of session. W/c mobility around unit with BUE & supervision with rest breaks PRN. Pt stands at dynavision with RW & min assist with tactile/verbal cuing for R knee extension in standing with pt engaging in dynavision with task focusing on standing balance & tolerance for BLE strengthening; pt able to stand 2 minutes + 3 minutes. Standing in // bars pt performs mini squats with multimodal cuing for technique & task focusing on proximal LE/glute & hamstring strengthening. Pt performs BLE LAQ's with 5# ankle weights on LLE, 2# on RLE with instructional cuing for technique & task focusing on quad strengthening. Pt is able to perform 3 sets x 15 reps on LLE, 3 sets x 5-10 reps on RLE. Pt propels w/c dayroom>room with BLE with focus on quad & hamstring strengthening with pt requiring 3 rest breaks 2/2 fatigue & cuing for proper positioning in w/c & technique. Pt left in w/c with chair alarm donned, call bell in reach, visitor present in room. Pain: no c/o pain reported  Therapy Documentation Precautions:  Precautions Precautions: Cervical, Back, Fall Precaution Booklet Issued: No Required Braces or Orthoses: Cervical Brace, Spinal Brace Cervical Brace: At all times Spinal Brace:  (at all times) Spinal Brace Comments: CTO Restrictions Weight Bearing Restrictions: No       Therapy/Group: Individual Therapy  Sandi Mariscal 03/04/2020, 2:50 PM

## 2020-03-04 NOTE — Progress Notes (Signed)
Physical Therapy Weekly Progress Note  Patient Details  Name: Mike Sexton MRN: [id number] Date of Birth: 1991/01/26  Beginning of progress report period: February 26, 2020 End of progress report period: March 04, 2020  Today's Date: 03/04/2020   Patient has met 4 of 4 short term goals.  Pt is making good progress towards LTG's. Pt currently performs bed mobility with supervision<>mod assist, squat pivot transfers with min assist & short distance gait (max 10-12 ft) with min/mod assist + w/c follow with RW & GRAFO. Pt continues to demonstrate decreased endurance & strength, impaired neuromuscular control of BLE (R>L) and experiences B knee buckling during gait. Pt would benefit from continued skilled PT treatment to address the deficits noted above/below to progress independence with gait & other functional mobility tasks prior to d/c.  Patient continues to demonstrate the following deficits muscle weakness, decreased cardiorespiratoy endurance, decreased coordination, decreased memory, and decreased standing balance, decreased postural control, decreased balance strategies and difficulty maintaining precautions and therefore will continue to benefit from skilled PT intervention to increase functional independence with mobility.  Patient progressing toward long term goals..  Continue plan of care.  PT Short Term Goals Week 1:  PT Short Term Goal 1 (Week 1): Pt will complete bed mobility with bed flat with mod assist. PT Short Term Goal 1 - Progress (Week 1): Met PT Short Term Goal 2 (Week 1): Pt will complete bed<>w/c withouth mechanical lift with max assist +1. PT Short Term Goal 2 - Progress (Week 1): Met PT Short Term Goal 3 (Week 1): Pt will propel w/c 75 ft with supervision. PT Short Term Goal 3 - Progress (Week 1): Met PT Short Term Goal 4 (Week 1): Pt will complete sit<>stand with max assist +1. PT Short Term Goal 4 - Progress (Week 1): Met Week 2:  PT Short Term Goal  1 (Week 2): Pt will intiate stair training with max assist +1. PT Short Term Goal 2 (Week 2): Pt will complete car transfer with mod assist +1. PT Short Term Goal 3 (Week 2): Pt will ambulate 50 ft with LRAD & mod assist +1.   Therapy Documentation Precautions:  Precautions Precautions: Cervical, Back, Fall Precaution Booklet Issued: No Required Braces or Orthoses: Cervical Brace, Spinal Brace Cervical Brace: At all times Spinal Brace:  (at all times) Spinal Brace Comments: CTO Restrictions Weight Bearing Restrictions: No  Therapy/Group: Individual Therapy  [name] 03/04/2020, 7:58 AM
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
## 2020-03-04 NOTE — Progress Notes (Signed)
Occupational Therapy Weekly Progress Note  Patient Details  Name: Mike Sexton MRN: [id number] Date of Birth: 07-08-1990  Beginning of progress report period: February 26, 2020 End of progress report period: March 04, 2020  Today's Date: 03/04/2020 OT Individual Time: 1100-1145 OT Individual Time Calculation (min): 45 min  and Today's Date: 03/04/2020 OT Missed Time: 15 Minutes Missed Time Reason: Patient fatigue   Patient has met 5 of 5 short term goals. Pt is making progress within his OT POC. Pt completes UB bathing and dressing with min A, and LB bathing/dressing with mod A. Pt completes ADL transfers with min A, using a squat pivot. Pt's functional activity tolerance has improved, as well as his pain management. Pt remains motivated to continue progressing toward his goals, continue with OT POC.   Patient continues to demonstrate the following deficits: muscle weakness, decreased cardiorespiratoy endurance and decreased sitting balance, decreased standing balance and decreased balance strategies and therefore will continue to benefit from skilled OT intervention to enhance overall performance with BADL.  Patient progressing toward long term goals..  Continue plan of care.  OT Short Term Goals Week 1:  OT Short Term Goal 1 (Week 1): patient will roll in bed with CS, complete side lying to/from sitting with min A OT Short Term Goal 1 - Progress (Week 1): Met OT Short Term Goal 2 (Week 1): patient will tolerate sitting edge of bed for adl tasks with CS for balance OT Short Term Goal 2 - Progress (Week 1): Met OT Short Term Goal 3 (Week 1): patient will complete UB bathing and dressing with min A OT Short Term Goal 3 - Progress (Week 1): Met OT Short Term Goal 4 (Week 1): patient will complete LB bathing and dressing with mod A using ADs as needed OT Short Term Goal 4 - Progress (Week 1): Met OT Short Term Goal 5 (Week 1): patient will complete sit pivot transfer with mod  A OT Short Term Goal 5 - Progress (Week 1): Met Week 2:  OT Short Term Goal 1 (Week 2): Pt will don UB clothing with (S) OT Short Term Goal 2 (Week 2): Pt will use AE PRN to don pants with min A OT Short Term Goal 3 (Week 2): Pt will complete toileting tasks with min A OT Short Term Goal 4 (Week 2): Pt will complete toileting transfer with CGA  Skilled Therapeutic Interventions/Progress Updates:    Pt received sitting in the w/c with c/o HA and occipital pain, requesting for pain intervention to be return to bed at end of session. Pt's sister present and given edu re today's conference including d/c date, future family education session, equipment to be ordered and home set up. Provided her with a home measurement sheet to fill out as well to ensure home accessibility is accounted for. Recommended TTB and BSC for pt at home. Pt agreeable to complete ADLs. Pt completed oral care at the sink with set up assist. No cueing necessary during session for adherence to cervical and thoracic precautions. Pt completed UB bathing with set up assist, min A to don gown. Pt completed hair care with shampoo cap with min A, but improved overhead reaching. Pt completed sit <> stand with CGA, with very close guarding 2/2 knee instability, but pt had no LOB this session. Pt completed peri hygiene in standing with min A for balance stabilization when removing 1 UE from the sink. Pt used reacher to don new shorts, requiring mod A overall to  thread over BLE. Pt declined any further participation 2/2 fatigue and HA increasing. He used RW to complete stand pivot transfer back to bed with min A. He required min A to lift his RLE into bed. Pt was left supine with all needs met, bed alarm set. 15 min missed.   Therapy Documentation Precautions:  Precautions Precautions: Cervical, Back, Fall Precaution Booklet Issued: No Required Braces or Orthoses: Cervical Brace, Spinal Brace Cervical Brace: At all times Spinal Brace:  (at  all times) Spinal Brace Comments: CTO Restrictions Weight Bearing Restrictions: No   Therapy/Group: Individual Therapy  Curtis Sites 03/04/2020, 10:48 AM

## 2020-03-05 ENCOUNTER — Inpatient Hospital Stay (HOSPITAL_COMMUNITY): Payer: Self-pay | Admitting: *Deleted

## 2020-03-05 ENCOUNTER — Inpatient Hospital Stay (HOSPITAL_COMMUNITY): Payer: Self-pay

## 2020-03-05 NOTE — Progress Notes (Signed)
Physical Therapy Session Note  Patient Details  Name: Mike Sexton MRN: [id number] Date of Birth: Jun 20, 1991  Today's Date: 03/05/2020 PT Individual Time: 1100-1200 PT Individual Time Calculation (min): 60 min   Short Term Goals: Week 2:  PT Short Term Goal 1 (Week 2): Pt will intiate stair training with max assist +1. PT Short Term Goal 2 (Week 2): Pt will complete car transfer with mod assist +1. PT Short Term Goal 3 (Week 2): Pt will ambulate 50 ft with LRAD & mod assist +1.  Skilled Therapeutic Interventions/Progress Updates:     Patient in w/c with his sister in the room, patient with CTO donned upon PT arrival. Patient alert and agreeable to PT session. Patient reported 4/10 headache during session, RN made aware. PT provided repositioning, rest breaks, and distraction as pain interventions throughout session.   Therapeutic Activity: Bed Mobility: Patient performed supine to/from sit with supervision. Performed log roll technique without cues. Transfers: Patient performed squat pivot transfers with CGA x1 and supervision x2 with cues for R foot placement and head hips relationship.   Wheelchair Mobility:  Patient propelled wheelchair ~100 feet and ~150 feet with supervision using B UEs. Provided verbal cues for increased stroke length for increased momentum with propulsion, use of breaks, and w/c set up for transfers during session.   Neuromuscular Re-ed: Patient performed the following balance and LE motor control activities in front of a mirror for visual feedback: -sit to from stand progressing from +2 assist with 3 musketeer technique, to B HHA, to +1 min A without UE support x2, provided facilitation for forward and R weight shift, and multimodal cues for R LE motor control throughout  -static standing balance with min A +2 x1 and min A +1 x1 for 2 min trials, progressed to CGA on second trial with improved R LE extension and quad concentric and hamstring  eccentric activation in standing for r knee stability -standing weight shifts x1 min with +2 facilitation and min A -standing reaching for large pegs on the L and placing them on the peg board on the R to promote R LE weight bearing and knee control ~2 min for 20 pegs -R DF in sitting progressing to full ROM with multimodal cues for DF activation -R knee extension/flexion in sitting, achieved full extension range without facilitation, initially unable to activate hamstrings to pull his foot back under the mat table (elevated mat table so his feet were not touching), progressed to palpable activation and small ROM with muscle activation using PNF tapping and multi-modal cues  Provided seated rest breaks between activities due to patient's decreased activity tolerance. Educated on R LE deficits and strategies for recovery and general SCI recovery during rest breaks.   Patient in bed with his sister in the room at end of session with breaks locked, bed alarm set, and all needs within reach.    Therapy Documentation Precautions:  Precautions Precautions: Cervical, Back, Fall Precaution Booklet Issued: No Required Braces or Orthoses: Cervical Brace, Spinal Brace Cervical Brace: At all times Spinal Brace:  (at all times) Spinal Brace Comments: CTO Restrictions Weight Bearing Restrictions: No   Therapy/Group: Individual Therapy  Kenniya Westrich L Charish Schroepfer PT, DPT  03/05/2020, 4:37 PM

## 2020-03-05 NOTE — Progress Notes (Signed)
Recreational Therapy Session Note  Patient Details  Name: Mike Sexton MRN: [id number] Date of Birth: Jun 15, 1991 Today's Date: 03/05/2020  Pain: no c/o Skilled Therapeutic Interventions/Progress Updates: Session focused on assisting PT with ambulation in Lite Gait.  Pt required assistance from PT for RLE placement and LRT for weight shifting.  Therapy/Group: Co-Treatment Edi Gorniak 03/05/2020, 3:34 PM

## 2020-03-05 NOTE — Progress Notes (Signed)
Occupational Therapy Session Note  Patient Details  Name: Mariah Milling [place] MRN: [id number] Date of Birth: 03-25-1991  Today's Date: 03/05/2020 OT Individual Time: 7092-9574 OT Individual Time Calculation (min): 55 min    Short Term Goals: Week 1:  OT Short Term Goal 1 (Week 1): patient will roll in bed with CS, complete side lying to/from sitting with min A OT Short Term Goal 1 - Progress (Week 1): Met OT Short Term Goal 2 (Week 1): patient will tolerate sitting edge of bed for adl tasks with CS for balance OT Short Term Goal 2 - Progress (Week 1): Met OT Short Term Goal 3 (Week 1): patient will complete UB bathing and dressing with min A OT Short Term Goal 3 - Progress (Week 1): Met OT Short Term Goal 4 (Week 1): patient will complete LB bathing and dressing with mod A using ADs as needed OT Short Term Goal 4 - Progress (Week 1): Met OT Short Term Goal 5 (Week 1): patient will complete sit pivot transfer with mod A OT Short Term Goal 5 - Progress (Week 1): Met  Skilled Therapeutic Interventions/Progress Updates:    1:1. Pt received in bed agreeable to OT. Pt completes Sup HOB elevated> seated EOB with bed rails with S and squat pivot transfer with MIN A. Pt bathes UB with se tup around brace and dons gown with set up. Pt able ot don B socks without sock aide, but unable ot don R shoe. Ot uses shoe funnel to don R shoe. OT educates on adapte stretching at bed level for hip internal rotation, calf and hamstring stretch using sheet. Pt completes standing balance activities with RW from w/c withCGA for standing toe taps to 4" step 2x5 for weight shifting and balance. Pt completes standing reaching with 1LE on 4" step for weight bearing into RLE with no instance of buckling reaching across to R to place horse shoes onto basketball hoop. Exited sessionw iht pt seated in w/c, exit alarmon and call light inr each   Therapy Documentation Precautions:  Precautions Precautions: Cervical,  Back, Fall Precaution Booklet Issued: No Required Braces or Orthoses: Cervical Brace, Spinal Brace Cervical Brace: At all times Spinal Brace:  (at all times) Spinal Brace Comments: CTO Restrictions Weight Bearing Restrictions: No General:   Vital Signs:  Pain: Pain Assessment Pain Scale: 0-10 Pain Score: 4  ADL: ADL Eating: Set up Where Assessed-Eating: Bed level Grooming: Moderate assistance Where Assessed-Grooming: Bed level Upper Body Bathing: Maximal assistance Where Assessed-Upper Body Bathing: Edge of bed Lower Body Bathing: Maximal assistance Where Assessed-Lower Body Bathing: Bed level Upper Body Dressing: Maximal assistance Where Assessed-Upper Body Dressing: Edge of bed Lower Body Dressing: Maximal assistance Where Assessed-Lower Body Dressing: Bed level Toileting: Dependent Vision   Perception    Praxis   Exercises:   Other Treatments:     Therapy/Group: Individual Therapy  Tonny Branch 03/05/2020, 10:03 AM

## 2020-03-05 NOTE — Progress Notes (Signed)
Physical Therapy Session Note  Patient Details  Name: Mike Sexton MRN: [id number] Date of Birth: [birth year]  Today's Date: 03/05/2020 PT Individual Time: 1351-1459 PT Individual Time Calculation (min): 68 min   Short Term Goals: Week 2:  PT Short Term Goal 1 (Week 2): Pt will intiate stair training with max assist +1. PT Short Term Goal 2 (Week 2): Pt will complete car transfer with mod assist +1. PT Short Term Goal 3 (Week 2): Pt will ambulate 50 ft with LRAD & mod assist +1.  Skilled Therapeutic Interventions/Progress Updates:  Interpreter present for session. Pt received in bed & agreeable to tx. Supine>R sidelying>sitting EOB with supervision, bed flat & bed rails. PT dons shoes & R GRAFO total assist. Squat pivot bed>w/c with min assist. In dayroom, pt transfers to standing in stedy & PT & RT dons lite gait harness total assist. Pt transferred onto treadmill with lite gait & engaged in gait training with PT providing assistance for RLE foot advancement & clearance, cuing for stepping, RT assisting with weight shifting L<>R.  0.3 mph x 102 ft with BUE support 0.4 mph x 127 ft with BUE support 0.2 mph x 51 ft without BUE support with multiple instances of knee (L>R) buckling 0.4 mph x 182 ft with BUE support with more instances of R knee buckling noted  Pt requires seated rest break between each gait trial 2/2 fatigue. Pt propels w/c back to room with BUE & supervision, performs hand hygiene at sink with supervision. Pt returns to bed, squat pivot min assist, sit>supine with supervision, bed flat & bed rails. Pt left in bed with alarm set, call bell & all needs in reach, 4 rails up per pt request. PT dons BLE SCD's & educates pt on need to wear them at night for DVT prevention.  Recreational therapist present for first half of session.  Therapy Documentation Precautions:  Precautions Precautions: Cervical, Back, Fall Precaution Booklet Issued: No Required Braces or  Orthoses: Cervical Brace, Spinal Brace Cervical Brace: At all times Spinal Brace:  (at all times) Spinal Brace Comments: CTO Restrictions Weight Bearing Restrictions: No  Pain: No c/o pain reported   Therapy/Group: Individual Therapy  Sandi Mariscal 03/05/2020, 3:04 PM
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
## 2020-03-06 ENCOUNTER — Inpatient Hospital Stay (HOSPITAL_COMMUNITY): Payer: Self-pay | Admitting: Occupational Therapy

## 2020-03-06 ENCOUNTER — Inpatient Hospital Stay (HOSPITAL_COMMUNITY): Payer: Self-pay

## 2020-03-06 ENCOUNTER — Inpatient Hospital Stay (HOSPITAL_COMMUNITY): Payer: Self-pay | Admitting: *Deleted

## 2020-03-06 MED ORDER — TOPIRAMATE 25 MG PO TABS
25.0000 mg | ORAL_TABLET | Freq: Two times a day (BID) | ORAL | Status: DC
  Administered 2020-03-06 – 2020-03-21 (×31): 25 mg via ORAL
  Filled 2020-03-06 (×33): qty 1
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
## 2020-03-06 NOTE — Progress Notes (Signed)
Occupational Therapy Session Note  Patient Details  Name: Mike Sexton MRN: [id number] Date of Birth: 05-Feb-1991  Today's Date: 03/06/2020 OT Individual Time: 1345-1456 OT Individual Time Calculation (min): 71 min    Short Term Goals: Week 1:  OT Short Term Goal 1 (Week 1): patient will roll in bed with CS, complete side lying to/from sitting with min A OT Short Term Goal 1 - Progress (Week 1): Met OT Short Term Goal 2 (Week 1): patient will tolerate sitting edge of bed for adl tasks with CS for balance OT Short Term Goal 2 - Progress (Week 1): Met OT Short Term Goal 3 (Week 1): patient will complete UB bathing and dressing with min A OT Short Term Goal 3 - Progress (Week 1): Met OT Short Term Goal 4 (Week 1): patient will complete LB bathing and dressing with mod A using ADs as needed OT Short Term Goal 4 - Progress (Week 1): Met OT Short Term Goal 5 (Week 1): patient will complete sit pivot transfer with mod A OT Short Term Goal 5 - Progress (Week 1): Met Week 2:  OT Short Term Goal 1 (Week 2): Pt will don UB clothing with (S) OT Short Term Goal 2 (Week 2): Pt will use AE PRN to don pants with min A OT Short Term Goal 3 (Week 2): Pt will complete toileting tasks with min A OT Short Term Goal 4 (Week 2): Pt will complete toileting transfer with CGA  Skilled Therapeutic Interventions/Progress Updates:    Patient in bed, alert and ready for therapy session.   Interpreter present for session.  He notes HA/pain behind right eye at level 5 - using cool washcloth over eye to manage pain.   Supine to sit with CS.  Mod A to donn sneakers.  Sit pivot transfer to/from bed, w/c, mat table CGA.  He tolerated unsupported sitting on mat table with CS for majority of session - completed visual motor activities with focus on right eye pursuits and binocular pursuits (ant/post caused mild strain otherwise no complaints)  Completed attempts with brock string - he states that he is able to see  single image but does not see string split or 2 balls in near or distant attempts (possible suppression)  Note that he continues with diplopia with right side gaze but improving straight ahead and to the left.   Completed trunk control and UB AROM activities with focus on back precautions, balance - completed LB AROM - improving right side control.   Mother in room at close of session.  Returned to bed at close of session, bed alarm set and call bell in hand.    Therapy Documentation Precautions:  Precautions Precautions: Cervical, Back, Fall Precaution Booklet Issued: No Required Braces or Orthoses: Cervical Brace, Spinal Brace Cervical Brace: At all times Spinal Brace:  (at all times) Spinal Brace Comments: CTO Restrictions Weight Bearing Restrictions: No   Therapy/Group: Individual Therapy  Carlos Levering 03/06/2020, 7:38 AM
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
## 2020-03-06 NOTE — Progress Notes (Signed)
Physical Therapy Session Note  Patient Details  Name: Mike Sexton MRN: [id number] Date of Birth: 1990-09-12  Today's Date: 03/06/2020 PT Individual Time: 0900-0958 PT Individual Time Calculation (min): 58 min   Short Term Goals: Week 1:  PT Short Term Goal 1 (Week 1): Pt will complete bed mobility with bed flat with mod assist. PT Short Term Goal 1 - Progress (Week 1): Met PT Short Term Goal 2 (Week 1): Pt will complete bed<>w/c withouth mechanical lift with max assist +1. PT Short Term Goal 2 - Progress (Week 1): Met PT Short Term Goal 3 (Week 1): Pt will propel w/c 75 ft with supervision. PT Short Term Goal 3 - Progress (Week 1): Met PT Short Term Goal 4 (Week 1): Pt will complete sit<>stand with max assist +1. PT Short Term Goal 4 - Progress (Week 1): Met Week 2:  PT Short Term Goal 1 (Week 2): Pt will intiate stair training with max assist +1. PT Short Term Goal 2 (Week 2): Pt will complete car transfer with mod assist +1. PT Short Term Goal 3 (Week 2): Pt will ambulate 50 ft with LRAD & mod assist +1.  Skilled Therapeutic Interventions/Progress Updates:     Patient in bed with CTO donned and his sister in the room upon PT arrival. Patient alert and agreeable to PT session. Patient reported 6/10 headache at beginning of session, reports that his headache is constant and wakes him up at night, RN and MD made aware. PT provided repositioning, rest breaks, and distraction as pain interventions throughout session. Live interpreter present throughout session.   Therapeutic Activity: Bed Mobility: Patient performed supine to sit with supervision performing log roll technique with use of bed rail without cues. Donned B tennis shoes and R GRAFO with total A with patient sitting EOB with supervision for sitting balance.  Transfers: Patient performed sit to/from stand x5 with min A-CGA using RW and 1 rail x1. Provided verbal cues for foot placement, hand placement, and forward  weight shift. Patient performed a squat pivot transfer bed>w/c with supervision and set-up assist. Provided cues for hand placement, board placement, and head-hips relationship for proper technique and decreased assist with transfers.  Patient brushed his teeth and washed his face at the sink seated in the w/c with set-up assist.   Gait Training:  Patient ambulated 15-20 feet x5 using RW and R GRAFO with mod-min A and intermittent min A for foot placement due to adduction with R swing phase, able to self correct with use of mirror and cues. No signs of knee buckling today, however PT provided close guarding to R LE for safety. Limited distance due to increased fatigue without BWS during gait. Provided verbal cues for erect posture, sequencing, pushing the walker further ahead to step in the middle of the walker for safety, and B quad and gluteal activation in stance.  Wheelchair Mobility:  Patient was transported in the w/c with total A throughout session for energy conservation and time management.  Neuromuscular Re-ed: Patient performed the following LE motor control activities: -step tap with R foot to seconds 3" step at the stairs with B UE support on rail x4 for increased hamstring and knee flexor activation with visual target, required min A for placement and cues for placing rather than dragging foor up/down steps This activity was challenging for the patient due to inability for him to look at his foot, recommend trials with 4-6" step in // bars with mirror in front for visual  feedback. Educated patient on this strategy for next session, unable to trial this session due to time constraints.  Patient in w/c with his sister in the room at end of session with breaks locked, seat belt alarm set, and all needs within reach.    Therapy Documentation Precautions:  Precautions Precautions: Cervical, Back, Fall Precaution Booklet Issued: No Required Braces or Orthoses: Cervical Brace, Spinal  Brace Cervical Brace: At all times Spinal Brace:  (at all times) Spinal Brace Comments: CTO Restrictions Weight Bearing Restrictions: No   Therapy/Group: Individual Therapy  Chrissi Crow L Anden Bartolo PT, DPT  03/06/2020, 4:00 PM

## 2020-03-07 ENCOUNTER — Inpatient Hospital Stay (HOSPITAL_COMMUNITY): Payer: Self-pay | Admitting: Physical Therapy

## 2020-03-07 ENCOUNTER — Inpatient Hospital Stay (HOSPITAL_COMMUNITY): Payer: Self-pay | Admitting: Occupational Therapy

## 2020-03-07 NOTE — Progress Notes (Signed)
Occupational Therapy Session Note  Patient Details  Name: Mike Sexton MRN: [id number] Date of Birth: 05-02-1991  Today's Date: 03/07/2020 OT Individual Time: 1345-1500 OT Individual Time Calculation (min): 75 min    Short Term Goals: Week 1:  OT Short Term Goal 1 (Week 1): patient will roll in bed with CS, complete side lying to/from sitting with min A OT Short Term Goal 1 - Progress (Week 1): Met OT Short Term Goal 2 (Week 1): patient will tolerate sitting edge of bed for adl tasks with CS for balance OT Short Term Goal 2 - Progress (Week 1): Met OT Short Term Goal 3 (Week 1): patient will complete UB bathing and dressing with min A OT Short Term Goal 3 - Progress (Week 1): Met OT Short Term Goal 4 (Week 1): patient will complete LB bathing and dressing with mod A using ADs as needed OT Short Term Goal 4 - Progress (Week 1): Met OT Short Term Goal 5 (Week 1): patient will complete sit pivot transfer with mod A OT Short Term Goal 5 - Progress (Week 1): Met Week 2:  OT Short Term Goal 1 (Week 2): Pt will don UB clothing with (S) OT Short Term Goal 2 (Week 2): Pt will use AE PRN to don pants with min A OT Short Term Goal 3 (Week 2): Pt will complete toileting tasks with min A OT Short Term Goal 4 (Week 2): Pt will complete toileting transfer with CGA      Skilled Therapeutic Interventions/Progress Updates:    Pt received in wc (interpretor present)  and requesting to wash up and change gowns for his top.  Pt set up at sink and completed b/d and oral care with set up.  Pt propelled chair to gym..  Worked on sit to stand at table with min A to rise to stand.  Pt worked on squats of 10-12 reps for 3 sets.   UE strengthening with 5 lb dowel bar with shoulder presses, rows, bicep curls 15 x 3 sets.  tricep presses from chair 15 x 3  Quad strengthening with resisted knee extensions using blue theraband.   Pt self propelled halfway to [employer]	[place], rested and then then  self propelled part of the way back to work on endurance.  Pt opted to sit in wc with all needs met and alarm on.   Therapy Documentation Precautions:  Precautions Precautions: Cervical, Back, Fall Precaution Booklet Issued: No Required Braces or Orthoses: Cervical Brace, Spinal Brace Cervical Brace: At all times Spinal Brace:  (at all times) Spinal Brace Comments: CTO Restrictions Weight Bearing Restrictions: No Therapy Vitals Temp: 98.1 F (36.7 C) Temp Source: Oral Pulse Rate: 78 Resp: 20 BP: 116/64 Oxygen Therapy SpO2: 100 % O2 Device: Room Air Pain: no c/o pain   ADL: ADL Eating: Set up Where Assessed-Eating: Bed level Grooming: Moderate assistance Where Assessed-Grooming: Bed level Upper Body Bathing: Maximal assistance Where Assessed-Upper Body Bathing: Edge of bed Lower Body Bathing: Maximal assistance Where Assessed-Lower Body Bathing: Bed level Upper Body Dressing: Maximal assistance Where Assessed-Upper Body Dressing: Edge of bed Lower Body Dressing: Maximal assistance Where Assessed-Lower Body Dressing: Bed level Toileting: Dependent  Therapy/Group: Individual Therapy  Prudy Candy 03/07/2020, 5:02 PM

## 2020-03-07 NOTE — Progress Notes (Signed)
Physical Therapy Session Note  Patient Details  Name: Mike Sexton MRN: [id number] Date of Birth: 05/12/1991  Today's Date: 03/07/2020 PT Individual Time: 0902-0959 PT Individual Time Calculation (min): 57 min   Short Term Goals: Week 2:  PT Short Term Goal 1 (Week 2): Pt will intiate stair training with max assist +1. PT Short Term Goal 2 (Week 2): Pt will complete car transfer with mod assist +1. PT Short Term Goal 3 (Week 2): Pt will ambulate 50 ft with LRAD & mod assist +1.  Skilled Therapeutic Interventions/Progress Updates: Pt presents supine in bed, w/ nursing finishing administering meds.  Pt agreeable to therapy, states pain in head at 6/10 w/ nurse stating pain meds just given.  Pt performed log roll to right w/ supervision and cueing for L knee flexion.  Sidelying to sit w/ side rail and supervision.  Pt does state some lightheadedness and encouraged to breathe through transfer.  Pt sat EOB w/o LOB or assist for total A to don R GRAFO and shoes, although able to push R foot into w/ assist.  Pt transferred sit to stand during session w/ min A from bed as well as w/c using split hand placement.  Pt amb short distance in room w/ mod A and RW.  Pt required blue T-band to R LE to improve placement, but improved foot clearance today.  Pt performed standing RLE placement to numbered circle to improve placement as well as marching w/ verbal cues through interpreter for use of quads w/ WB on right.Henrene Dodge used for visual input , still requiring T-band to maintain abduction and BOS.  Pt amb multiple trials up to 20' w/ mod A of 1 person but manual assist to maintain BOS.  Pt w/ good clearance of R foot, but verbal cues for walker advancement and step length.  Pt tends to bump the front of RW.  Pt returned to room and chair alarm on w/ all needs in reach.  Removed B shoes and AFO while remaining in w/c.     Therapy Documentation Precautions:  Precautions Precautions: Cervical,  Back, Fall Precaution Booklet Issued: No Required Braces or Orthoses: Cervical Brace, Spinal Brace Cervical Brace: At all times Spinal Brace:  (at all times) Spinal Brace Comments: CTO Restrictions Weight Bearing Restrictions: No General:   Vital Signs:  Pain:6/10 headache, just receiving pain meds from nurse. Pain Assessment Pain Scale: 0-10 Pain Score: 7  (7) Pain Location: Head    Therapy/Group: Individual Therapy  [name] 03/07/2020, 10:01 AM
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
## 2020-03-07 NOTE — Progress Notes (Signed)
Physical Therapy Session Note  Patient Details  Name: Mike Sexton MRN: [id number] Date of Birth: [birth year]  Today's Date: 03/07/2020 PT Individual Time: 1106-1206 PT Individual Time Calculation (min): 60 min   Short Term Goals: Week 2:  PT Short Term Goal 1 (Week 2): Pt will intiate stair training with max assist +1. PT Short Term Goal 2 (Week 2): Pt will complete car transfer with mod assist +1. PT Short Term Goal 3 (Week 2): Pt will ambulate 50 ft with LRAD & mod assist +1.  Skilled Therapeutic Interventions/Progress Updates:  Pt received in w/c & agreeable to tx. Pt dons L shoe, PT dons R shoe & GRAFO max assist. W/c mobility throughout unit with set up assist with BUE for strengthening & cardiopulmonary endurance training. Stand pivot w/c<>car with RW & min assist with instructional cuing for sequencing & technique with pt using BUE to assist BLE in/out of car, PT providing tactile cuing at R knee for extension in stance. PT again educates pt on need to ride home in car or smaller SUV vs large truck. Squat pivot w/c<>mat table, w/c<>nu-step with CGA. Sit<>stand from EOM with occasional cuing for hand placement & min assist. Pt engages in standing & reaching/tossing beanbags with 1UE support on RW, min assist with PT providing tactile cuing at R knee for extension with focus on BLE strengthening & standing tolerance, & standing balance. Nu-step on level 2 x 8 min with BLE only with focus on BLE strengthening & NMR with pt able to maintain RLE neutral alignment without LE attachment, which is an improvement. Educated pt on need to reassess LOF next week & discuss further re: potential need to d/c at w/c vs ambulatory level depending on BLE strengthening & safety with gait - pt agreeable to this. Also educated pt on potential need for ramp at home if he is unable to safely negotiate stairs at time of d/c. At end of session pt left in w/c with chair alarm donned, call bell in reach,  visitor present in room. Encouraged pt to eat lunch in w/c before going to bed.  Educated pt on purpose of CTO to keep spinal alignment to ensure proper healing.  Interpreter present for session.   Therapy Documentation Precautions:  Precautions Precautions: Cervical, Back, Fall Precaution Booklet Issued: No Required Braces or Orthoses: Cervical Brace, Spinal Brace Cervical Brace: At all times Spinal Brace:  (at all times) Spinal Brace Comments: CTO Restrictions Weight Bearing Restrictions: No  Pain: 5/10 HA - but states he's premedicated   Therapy/Group: Individual Therapy  Sandi Mariscal 03/07/2020, 12:15 PM

## 2020-03-08 DIAGNOSIS — K5903 Drug induced constipation: Secondary | ICD-10-CM

## 2020-03-08 DIAGNOSIS — R339 Retention of urine, unspecified: Secondary | ICD-10-CM

## 2020-03-08 DIAGNOSIS — R0989 Other specified symptoms and signs involving the circulatory and respiratory systems: Secondary | ICD-10-CM

## 2020-03-08 DIAGNOSIS — K5900 Constipation, unspecified: Secondary | ICD-10-CM

## 2020-03-08 DIAGNOSIS — D62 Acute posthemorrhagic anemia: Secondary | ICD-10-CM
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
## 2020-03-09 ENCOUNTER — Inpatient Hospital Stay (HOSPITAL_COMMUNITY): Payer: Self-pay | Admitting: Physical Therapy
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
## 2020-03-09 NOTE — Progress Notes (Signed)
Physical Therapy Session Note  Patient Details  Name: Mike Sexton MRN: [id number] Date of Birth: 1990-09-27  Today's Date: 03/09/2020 PT Individual Time: 0900-1000 PT Individual Time Calculation (min): 60 min   Short Term Goals: Week 2:  PT Short Term Goal 1 (Week 2): Pt will intiate stair training with max assist +1. PT Short Term Goal 2 (Week 2): Pt will complete car transfer with mod assist +1. PT Short Term Goal 3 (Week 2): Pt will ambulate 50 ft with LRAD & mod assist +1.  Skilled Therapeutic Interventions/Progress Updates:    Pt received seated in bed, agreeable to PT session. Pt reports a headache at rest, RN able to provide pain medication at end of session. Bed mobility Supervision with good demonstration of log roll technique. Assisted pt with donning tennis shoes and R GRAFO while seated EOB. Squat pivot transfer to w/c with CGA. Dependent transport via w/c to/from therapy gym for energy conservation. Squat pivot transfer w/c to/from mat table with CGA. Sit to stand with min A to RW. Assisted pt with locking/unlocking CTO for sit to stand transfer. Sit to stand 3 x 5 reps from increasingly lower mat table to RW with min A with focus on UE placement and anterior weight shift. Standing squats 2 x 10 reps with RW and min A for balance, use of mat table as tactile cue for squat depth and mirror for visual feedback for knee alignment. Pt relies heavily on BUE to return to full upright stance during squats and fatigues quickly with this activity. Standing alt L/R 4" step-taps with RW and min A for balance, pt able to perform 5-7 reps before RLE fatigues and no longer able to perform correctly. Pt also exhibits compensatory strategies to lift RLE onto step including circumduction. Pt left seated in w/c in room with needs in reach, quick release belt and chair alarm in place at end of session. Interpreter present during therapy session.  Therapy Documentation Precautions:   Precautions Precautions: Cervical, Back, Fall Precaution Booklet Issued: No Required Braces or Orthoses: Cervical Brace, Spinal Brace Cervical Brace: At all times Spinal Brace:  (at all times) Spinal Brace Comments: CTO Restrictions Weight Bearing Restrictions: No    Therapy/Group: Individual Therapy   Peter Congo, PT, DPT  03/09/2020, 11:54 AM
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
## 2020-03-10 ENCOUNTER — Inpatient Hospital Stay (HOSPITAL_COMMUNITY): Payer: Self-pay | Admitting: Occupational Therapy

## 2020-03-10 ENCOUNTER — Inpatient Hospital Stay (HOSPITAL_COMMUNITY): Payer: Self-pay | Admitting: Physical Therapy

## 2020-03-10 LAB — BASIC METABOLIC PANEL
Anion gap: 9 (ref 5–15)
BUN: 9 mg/dL (ref 6–20)
CO2: 26 mmol/L (ref 22–32)
Calcium: 9.5 mg/dL (ref 8.9–10.3)
Chloride: 104 mmol/L (ref 98–111)
Creatinine, Ser: 0.97 mg/dL (ref 0.61–1.24)
GFR calc Af Amer: >60 mL/min (ref >60)
GFR calc non Af Amer: >60 mL/min (ref >60)
Glucose, Bld: 114 mg/dL — ABNORMAL HIGH (ref 70–99)
Potassium: 4 mmol/L (ref 3.5–5.1)
Sodium: 139 mmol/L (ref 135–145)

## 2020-03-10 LAB — CBC WITH DIFFERENTIAL/PLATELET
Abs Immature Granulocytes: 0.03 10*3/uL (ref 0.00–0.07)
Basophils Absolute: 0.1 10*3/uL (ref 0.0–0.1)
Basophils Relative: 1 %
Eosinophils Absolute: 0.6 10*3/uL — ABNORMAL HIGH (ref 0.0–0.5)
Eosinophils Relative: 7 %
HCT: 34.6 % — ABNORMAL LOW (ref 39.0–52.0)
Hemoglobin: 10.9 g/dL — ABNORMAL LOW (ref 13.0–17.0)
Immature Granulocytes: 0 %
Lymphocytes Relative: 33 %
Lymphs Abs: 2.9 10*3/uL (ref 0.7–4.0)
MCH: 28.5 pg (ref 26.0–34.0)
MCHC: 31.5 g/dL (ref 30.0–36.0)
MCV: 90.6 fL (ref 80.0–100.0)
Monocytes Absolute: 0.8 10*3/uL (ref 0.1–1.0)
Monocytes Relative: 9 %
Neutro Abs: 4.4 10*3/uL (ref 1.7–7.7)
Neutrophils Relative %: 50 %
Platelets: 511 10*3/uL — ABNORMAL HIGH (ref 150–400)
RBC: 3.82 MIL/uL — ABNORMAL LOW (ref 4.22–5.81)
RDW: 13.1 % (ref 11.5–15.5)
WBC: 8.7 10*3/uL (ref 4.0–10.5)
nRBC: 0 % (ref 0.0–0.2)

## 2020-03-10 NOTE — Progress Notes (Signed)
Physical Therapy Session Note  Patient Details  Name: Mike Sexton MRN: [id number] Date of Birth: January 02, 1991  Today's Date: 03/10/2020 PT Individual Time: 1419-1530 PT Individual Time Calculation (min): 71 min   Short Term Goals: Week 2:  PT Short Term Goal 1 (Week 2): Pt will intiate stair training with max assist +1. PT Short Term Goal 2 (Week 2): Pt will complete car transfer with mod assist +1. PT Short Term Goal 3 (Week 2): Pt will ambulate 50 ft with LRAD & mod assist +1.  Skilled Therapeutic Interventions/Progress Updates:  Pt received in w/c & agreeable to tx. Professional interpreter present for session. Pt's visitor without mask on & PT educates her on masking policy & she dons one. W/c mobility with BUE & set up assist throughout unit. PT begins to instruct pt on w/c parts management with pt still requiring cuing & assist throughout session, & assistance to retreive leg rests off floor 2/2 back precautions. PT also reinforces back precautions as pt attempts to twist to turn to look behind him. Sit<>stands with min assist & gait x 13 ft + 15 ft with RW, R GRAFO & min assist + w/c follow for safety. PT provides assistance for increased step width RLE as pt with some adductor tone & provides cuing for increased step width. PT educates pt on need to safely increase gait distances and increase gait speed if possible. Stand pivot w/c<>kinetron with RW & min assist with ongoing cuing for RLE placement as pt with poor awareness of extremity. Pt utilizes kinteron in standing with BUE>LUE support with increasing assist from PT & PT blocking B knees to prevent buckling with task focusing on BLE strengthening with PT providing cuing for terminal knee extension. Pt completes stand pivot transfer w/c<>bed & couch in apartment with RW & min assist. Pt completes bed mobility in apartment with supervision overall & cuing for log rolling (supine>sidelying>sitting) as well as exhalation with  exertion during movement. Pt completes sit<>stand from low, compliant couch with min assist. Back in room, pt completes squat pivot transfer with supervision w/c>bed and sit>supine with supervision. Pt left in bed with alarm set, call bell & all needs in reach.   Therapy Documentation Precautions:  Precautions Precautions: Cervical, Back, Fall Precaution Booklet Issued: No Required Braces or Orthoses: Cervical Brace, Spinal Brace Cervical Brace: At all times Spinal Brace:  (at all times) Spinal Brace Comments: CTO Restrictions Weight Bearing Restrictions: No  Pain: No c/o pain reported    Therapy/Group: Individual Therapy  Sandi Mariscal 03/10/2020, 3:50 PM
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
## 2020-03-10 NOTE — Progress Notes (Signed)
Occupational Therapy Session Note  Patient Details  Name: Mike Sexton MRN: [id number] Date of Birth: [birth year]  Today's Date: 03/10/2020 OT Individual Time: 0900-1000   &   1100-1200 OT Individual Time Calculation (min): 60 min &  60 min   Short Term Goals: Week 1:  OT Short Term Goal 1 (Week 1): patient will roll in bed with CS, complete side lying to/from sitting with min A OT Short Term Goal 1 - Progress (Week 1): Met OT Short Term Goal 2 (Week 1): patient will tolerate sitting edge of bed for adl tasks with CS for balance OT Short Term Goal 2 - Progress (Week 1): Met OT Short Term Goal 3 (Week 1): patient will complete UB bathing and dressing with min A OT Short Term Goal 3 - Progress (Week 1): Met OT Short Term Goal 4 (Week 1): patient will complete LB bathing and dressing with mod A using ADs as needed OT Short Term Goal 4 - Progress (Week 1): Met OT Short Term Goal 5 (Week 1): patient will complete sit pivot transfer with mod A OT Short Term Goal 5 - Progress (Week 1): Met Week 2:  OT Short Term Goal 1 (Week 2): Pt will don UB clothing with (S) OT Short Term Goal 2 (Week 2): Pt will use AE PRN to don pants with min A OT Short Term Goal 3 (Week 2): Pt will complete toileting tasks with min A OT Short Term Goal 4 (Week 2): Pt will complete toileting transfer with CGA  Skilled Therapeutic Interventions/Progress Updates:    1st session:   Patient in bed, alert and ready for therapy session.  Aunt and interpreter present for session.  He notes pain in right lateral chest with activity - MD and nursing aware, pain patch provided by nursing during session.  Supine to sitting edge of bed with CS.  Sit pivot transfer to w/c CS/set up.  Completed grooming tasks w/c level at sink with set up.  donns hospital gown with min A to manage length/tying.  He declines OH shirt due to ease of gown use with CTO.  He is able to donn left shoe with set up by crossing leg, right shoe and  AFO max A.  Able to propel w/c to/from therapy gym.  Sit pivot transfer to/from Nustep with CS, completed 2 x 5 minutes at level 3.  He remained seated in w/c at close of session with seat belt alarm set and call bell in reach.     2nd session:   Patient is seated in w/c and ready for afternoon session.  He is able to propel w/c to/from therapy gym.  Sit pivot transfer to mat table with CS.  Sit to stand from mat table with CGA, cues for technique and RW for support.  Practiced reach with bilateral UEs in stance to simulate CM/hygiene.  Completed standing step and weight shift activities - no buckling with these activities.  LB exercises in seated position.  Completed visual motor exercises with improved tolerance and depth with brock string - able to see "x" pattern and reset with min cues as needed.  SPT with RW mat to w/c with CGA.  He remained seated in w/c at close of session, seat belt alarm set and call bell in reach.      Therapy Documentation Precautions:  Precautions Precautions: Cervical, Back, Fall Precaution Booklet Issued: No Required Braces or Orthoses: Cervical Brace, Spinal Brace Cervical Brace: At all times Spinal Brace:  (  at all times) Spinal Brace Comments: CTO Restrictions Weight Bearing Restrictions: No  Therapy/Group: Individual Therapy  Carlos Levering 03/10/2020, 7:38 AM

## 2020-03-11 ENCOUNTER — Inpatient Hospital Stay (HOSPITAL_COMMUNITY): Payer: Self-pay | Admitting: Occupational Therapy

## 2020-03-11 ENCOUNTER — Inpatient Hospital Stay (HOSPITAL_COMMUNITY): Payer: Self-pay | Admitting: Physical Therapy

## 2020-03-11 NOTE — Progress Notes (Signed)
Occupational Therapy Session Note  Patient Details  Name: Mike Sexton MRN: [id number] Date of Birth: Dec 31, 1990  Today's Date: 03/11/2020 OT Individual Time: 0900-1000   &   1100-1200 OT Individual Time Calculation (min): 60 min  &   60 min   Short Term Goals: Week 2:  OT Short Term Goal 1 (Week 2): Pt will don UB clothing with (S) OT Short Term Goal 2 (Week 2): Pt will use AE PRN to don pants with min A OT Short Term Goal 3 (Week 2): Pt will complete toileting tasks with min A OT Short Term Goal 4 (Week 2): Pt will complete toileting transfer with CGA  Skilled Therapeutic Interventions/Progress Updates:    1st session:   Patient in bed, alert and ready for therapy session.  He notes mild pain in right flank, nursing provided new pain patch during session.  LB dressed prior to arrival - he notes that his family member assisted with donning shorts - he is encouraged to complete with less assistance.  Supine to sitting CS.  Sit to stand and SPT with RW to w/c CGA.  He completed grooming, UB bathing and dressing w/c level at sink with CS/set up.  Able to propel w/c to and from therapy gym.  Completed 8 minutes on nustep (SPT with RW CGA)  W/c push ups, squats with limited UB assistance with CGA.  Completed light dowel exercises with max reps in 30 second intervals.  He remained seated in w/c at close of session, seat belt alarm set and call bell in reach.     2nd session:   Patient seated in w/c, ready for therapy session.  Able to propel w/c to/from therapy gyms.  Completed dynavision with 1.5# wrist weights (2 minutes, whole screen, bilateral UEs) Trial 1 seated = 1.74sec,  Trial 2 in stance = 2.67 sec, Trial 2 in stance = 2.18 sec Completed UB ergometer seated 10 minutes.   Completed visual motor - brock string exercises - note intermittent suppression  Standing with RW with weight shifts for 3 minutes.  He remained seated in w/c at close of session, seat belt alarm set and  call bell in reach.      Therapy Documentation Precautions:  Precautions Precautions: Cervical, Back, Fall Precaution Booklet Issued: No Required Braces or Orthoses: Cervical Brace, Spinal Brace Cervical Brace: At all times Spinal Brace:  (at all times) Spinal Brace Comments: CTO Restrictions Weight Bearing Restrictions: No   Therapy/Group: Individual Therapy  Barrie Lyme 03/11/2020, 7:31 AM
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
## 2020-03-11 NOTE — Plan of Care (Signed)
Goals upgraded 2/2 good progress.  Problem: Sit to Stand Goal: LTG:  Patient will perform sit to stand with assistance level (PT) Description: LTG:  Patient will perform sit to stand with assistance level (PT) Flowsheets (Taken 03/11/2020 1522) LTG: PT will perform sit to stand in preparation for functional mobility with assistance level: (upgrade 2/2 good progress) Contact Guard/Touching assist Note: upgrade 2/2 good progress   Problem: RH Ambulation Goal: LTG Patient will ambulate in controlled environment (PT) Description: LTG: Patient will ambulate in a controlled environment, # of feet with assistance (PT). Flowsheets (Taken 03/11/2020 1522) LTG: Pt will ambulate in controlled environ  assist needed:: Minimal Assistance - Patient > 75% LTG: Ambulation distance in controlled environment: (upgrade 2/2 good progress) 75 ft with LRAD Note: upgrade 2/2 good progress Goal: LTG Patient will ambulate in home environment (PT) Description: LTG: Patient will ambulate in home environment, # of feet with assistance (PT). Flowsheets (Taken 03/11/2020 1522) LTG: Pt will ambulate in home environ  assist needed:: Minimal Assistance - Patient > 75% LTG: Ambulation distance in home environment: (upgrade 2/2 good progress) 50 ft with LRAD Note: upgrade 2/2 good progress
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
## 2020-03-11 NOTE — Progress Notes (Signed)
Patient ID: Mike Sexton, male   DOB: 15-Mar-1991, 29 y.o.   MRN: [id number]  SW called pt sister [name] [phone]) to provide updates from team conference. SW discussed d/c recommendations such as DME: RW, w/c,TTB,and 3in1 BSC. SW discussed if they would like to use [employer] for charity. SW explained charity process again. SW also disussed ramp needed at d/c. If unable to have in place, she reports her brother and her husband are able to get him into the home. [name] confirmed with her and her sister [name]. Mon/TUes 1pm-3pm with her and their mother. D/c address: [address], [place], [place] [address].  SW ordered DME with [employer] via parachute. Contact person for DME will be his father [name].   Cecile Sheerer, MSW, LCSWA Office: [phone] Cell: [phone] Fax: [phone]

## 2020-03-11 NOTE — Progress Notes (Addendum)
Physical Therapy Weekly Progress Note  Patient Details  Name: Mike Sexton MRN: [id number] Date of Birth: 03-28-1991  Beginning of progress report period: March 04, 2020 End of progress report period: March 11, 2020  Today's Date: 03/11/2020 PT Individual Time: 1415-1530 PT Individual Time Calculation (min): 75 min   Patient has met 3 of 3 short term goals.  Pt is making good progress with functional mobility. Pt was able to increase ambulation distances on this date with use of RW & min assist and initiate stair training. Pt currently requires min assist overall with w/c follow for safety with gait. Pt would benefit from continued skilled PT treatment to focus on strengthening, balance, & increasing independence. Pt will need B rails installed at mother's house prior to d/c for increased safety with stairs & will require hands on caregiver training prior to d/c to ensure safety for caregiver & pt.   Patient continues to demonstrate the following deficits muscle weakness, decreased cardiorespiratoy endurance, decreased coordination, decreased visual acuity, and decreased sitting balance, decreased standing balance, decreased postural control, decreased balance strategies and difficulty maintaining precautions and therefore will continue to benefit from skilled PT intervention to increase functional independence with mobility.  Patient progressing toward long term goals..  Continue plan of care.  PT Short Term Goals Week 2:  PT Short Term Goal 1 (Week 2): Pt will intiate stair training with max assist +1. PT Short Term Goal 1 - Progress (Week 2): Met PT Short Term Goal 2 (Week 2): Pt will complete car transfer with mod assist +1. PT Short Term Goal 2 - Progress (Week 2): Met PT Short Term Goal 3 (Week 2): Pt will ambulate 50 ft with LRAD & mod assist +1. PT Short Term Goal 3 - Progress (Week 2): Met Week 3:  PT Short Term Goal 1 (Week 3): STG = LTG due to estimated d/c  date.  Skilled Therapeutic Interventions/Progress Updates:  Pt received in w/c & agreeable to tx. W/c mobility around unit with BUE & set up assist. PT instructs pt on stair negotiation with compensatory pattern & pt negotiates 2 steps + 3 steps (3") with B rails & min/mod assist & 2nd person present for safety. Pt then negotiates one 6" step 4 times repeatedly with B rails & min assist with no buckling noted. Educated pt on need to have B rails installed at his mother's home prior to d/c with pt reporting this can be done & he will be able to reach them both simultaneously. Gait training x 60 ft + 70 ft with min assist & w/c follow with R GRAFO & RW. Pt demonstrates improved RLE step width and no buckling noted, with pt able to increase gait distance on this date. Gait x 10 ft without R GRAFO with min assist & RW with pt demonstrating slightly impaired neuromuscular control RLE and slightly decreased foot clearance with pt reporting he feels his leg is stronger with brace. Stand pivot w/c<>EOM with RW & min assist & from elevated EOM pt performs 3x then 5x sit<>stand without BUE support with task focusing on BLE strengthening with cuing to not push back on mat table and focus on eccentric control with good return demo from pt. Squat pivot w/<>nustep with CGA & pt utilizes nu-step with BLE only on level 4 x 8 minutes for BLE strengthening & NMR. At end of session pt left in w/c with chair alarm donned & call bell in reach.  Addendum: PT observed clonus in  RLE - MD made aware.  Therapy Documentation Precautions:  Precautions Precautions: Cervical, Back, Fall Precaution Booklet Issued: No Required Braces or Orthoses: Cervical Brace, Spinal Brace Cervical Brace: At all times Spinal Brace:  (at all times) Spinal Brace Comments: CTO Restrictions Weight Bearing Restrictions: No  Pain: 4/10 HA - pt reports he's premedicated  Therapy/Group: Individual Therapy  Mike Sexton 03/11/2020, 3:43 PM

## 2020-03-12 ENCOUNTER — Inpatient Hospital Stay (HOSPITAL_COMMUNITY): Payer: Self-pay | Admitting: Occupational Therapy

## 2020-03-12 ENCOUNTER — Encounter (HOSPITAL_COMMUNITY): Payer: Self-pay | Admitting: Psychology

## 2020-03-12 ENCOUNTER — Inpatient Hospital Stay (HOSPITAL_COMMUNITY): Payer: Self-pay | Admitting: Physical Therapy
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
## 2020-03-12 NOTE — Plan of Care (Signed)
  Problem: RH Balance Goal: LTG Patient will maintain dynamic standing with ADLs (OT) Description: LTG:  Patient will maintain dynamic standing balance with assist during activities of daily living (OT)  Flowsheets (Taken 03/12/2020 1219) LTG: Pt will maintain dynamic standing balance during ADLs with: (S with use of RW; upgraded due to pt's progression.) Supervision/Verbal cueing Note: S with use of RW; upgraded due to pt's progression.    Problem: Sit to Stand Goal: LTG:  Patient will perform sit to stand in prep for activites of daily living with assistance level (OT) Description: LTG:  Patient will perform sit to stand in prep for activites of daily living with assistance level (OT) Flowsheets (Taken 03/12/2020 1219) LTG: PT will perform sit to stand in prep for activites of daily living with assistance level: (S with use of RW; upgraded due to pt's progression.) Supervision/Verbal cueing Note: S with use of RW; upgraded due to pt's progression.    Problem: RH Bathing Goal: LTG Patient will bathe all body parts with assist levels (OT) Description: LTG: Patient will bathe all body parts with assist levels (OT) Flowsheets (Taken 03/12/2020 1219) LTG: Pt will perform bathing with assistance level/cueing: (S with use of RW; upgraded due to pt's progression.) Supervision/Verbal cueing LTG: Position pt will perform bathing:  At sink  Sit to Stand Note: S with use of RW; upgraded due to pt's progression.    Problem: RH Dressing Goal: LTG Patient will perform lower body dressing w/assist (OT) Description: LTG: Patient will perform lower body dressing with assist, with/without cues in positioning using equipment (OT) Flowsheets (Taken 03/12/2020 1219) LTG: Pt will perform lower body dressing with assistance level of: (S with use of RW; upgraded due to pt's progression.) Supervision/Verbal cueing Note: S with use of RW; upgraded due to pt's progression.    Problem: RH Toileting Goal: LTG  Patient will perform toileting task (3/3 steps) with assistance level (OT) Description: LTG: Patient will perform toileting task (3/3 steps) with assistance level (OT)  Flowsheets (Taken 03/12/2020 1219) LTG: Pt will perform toileting task (3/3 steps) with assistance level: (S with use of RW; upgraded due to pt's progression.) Supervision/Verbal cueing Note: S with use of RW; upgraded due to pt's progression.    Problem: RH Toilet Transfers Goal: LTG Patient will perform toilet transfers w/assist (OT) Description: LTG: Patient will perform toilet transfers with assist, with/without cues using equipment (OT) Flowsheets (Taken 03/12/2020 1219) LTG: Pt will perform toilet transfers with assistance level of: (CGA with use of RW; upgraded due to pt's progression.) Contact Guard/Touching assist Note: CGA with use of RW; upgraded due to pt's progression.

## 2020-03-12 NOTE — Progress Notes (Signed)
Occupational Therapy Weekly Progress Note  Patient Details  Name: Mike Sexton MRN: [id number] Date of Birth: 1991/05/11  Beginning of progress report period: March 04, 2020 End of progress report period: March 12, 2020  Today's Date: 03/12/2020 OT Individual Time: 0900-1000 and 1100-1200 OT Individual Time Calculation (min): 60 min and 60 min    Patient has met 4 of 4 short term goals.  Pt has made excellent progress this week with his ability to stand, take steps with the RW, lift his legs in a figure 4 position during LB dressing.  He has increased pain tolerance, increased strength and stability. He does often discuss the decreased sensation and numbness in his legs and how that is so challenging for him with walking.  Patient continues to demonstrate the following deficits: muscle weakness and muscle paralysis, unbalanced muscle activation and decreased coordination, decreased visual acuity and decreased standing balance and therefore will continue to benefit from skilled OT intervention to enhance overall performance with BADL.  Patient progressing toward long term goals..  Plan of care revisions: LTGs upgraded. See below. .   Problem: RH Balance Goal: LTG Patient will maintain dynamic standing with ADLs (OT) Description: LTG:  Patient will maintain dynamic standing balance with assist during activities of daily living (OT)  Flowsheets (Taken 03/12/2020 1219) LTG: Pt will maintain dynamic standing balance during ADLs with: (S with use of RW; upgraded due to pt's progression.) Supervision/Verbal cueing Note: S with use of RW; upgraded due to pt's progression.    Problem: Sit to Stand Goal: LTG:  Patient will perform sit to stand in prep for activites of daily living with assistance level (OT) Description: LTG:  Patient will perform sit to stand in prep for activites of daily living with assistance level (OT) Flowsheets (Taken 03/12/2020 1219) LTG: PT will  perform sit to stand in prep for activites of daily living with assistance level: (S with use of RW; upgraded due to pt's progression.) Supervision/Verbal cueing Note: S with use of RW; upgraded due to pt's progression.    Problem: RH Bathing Goal: LTG Patient will bathe all body parts with assist levels (OT) Description: LTG: Patient will bathe all body parts with assist levels (OT) Flowsheets (Taken 03/12/2020 1219) LTG: Pt will perform bathing with assistance level/cueing: (S with use of RW; upgraded due to pt's progression.) Supervision/Verbal cueing LTG: Position pt will perform bathing: . At sink . Sit to Stand Note: S with use of RW; upgraded due to pt's progression.    Problem: RH Dressing Goal: LTG Patient will perform lower body dressing w/assist (OT) Description: LTG: Patient will perform lower body dressing with assist, with/without cues in positioning using equipment (OT) Flowsheets (Taken 03/12/2020 1219) LTG: Pt will perform lower body dressing with assistance level of: (S with use of RW; upgraded due to pt's progression.) Supervision/Verbal cueing Note: S with use of RW; upgraded due to pt's progression.    Problem: RH Toileting Goal: LTG Patient will perform toileting task (3/3 steps) with assistance level (OT) Description: LTG: Patient will perform toileting task (3/3 steps) with assistance level (OT)  Flowsheets (Taken 03/12/2020 1219) LTG: Pt will perform toileting task (3/3 steps) with assistance level: (S with use of RW; upgraded due to pt's progression.) Supervision/Verbal cueing Note: S with use of RW; upgraded due to pt's progression.    Problem: RH Toilet Transfers Goal: LTG Patient will perform toilet transfers w/assist (OT) Description: LTG: Patient will perform toilet transfers with assist, with/without cues using equipment (OT)  Flowsheets (Taken 03/12/2020 1219) LTG: Pt will perform toilet transfers with assistance level of: (CGA with use of RW; upgraded due  to pt's progression.) Contact Guard/Touching assist Note: CGA with use of RW; upgraded due to pt's progression.      OT Short Term Goals Week 1:  OT Short Term Goal 1 (Week 1): patient will roll in bed with CS, complete side lying to/from sitting with min A OT Short Term Goal 1 - Progress (Week 1): Met OT Short Term Goal 2 (Week 1): patient will tolerate sitting edge of bed for adl tasks with CS for balance OT Short Term Goal 2 - Progress (Week 1): Met OT Short Term Goal 3 (Week 1): patient will complete UB bathing and dressing with min A OT Short Term Goal 3 - Progress (Week 1): Met OT Short Term Goal 4 (Week 1): patient will complete LB bathing and dressing with mod A using ADs as needed OT Short Term Goal 4 - Progress (Week 1): Met OT Short Term Goal 5 (Week 1): patient will complete sit pivot transfer with mod A OT Short Term Goal 5 - Progress (Week 1): Met Week 2:  OT Short Term Goal 1 (Week 2): Pt will don UB clothing with (S) OT Short Term Goal 1 - Progress (Week 2): Met OT Short Term Goal 2 (Week 2): Pt will use AE PRN to don pants with min A OT Short Term Goal 2 - Progress (Week 2): Met OT Short Term Goal 3 (Week 2): Pt will complete toileting tasks with min A OT Short Term Goal 3 - Progress (Week 2): Met OT Short Term Goal 4 (Week 2): Pt will complete toileting transfer with CGA OT Short Term Goal 4 - Progress (Week 2): Other (comment) (Met goal from a Wc level.  Needs to work on from a RW level.) Week 3:  OT Short Term Goal 1 (Week 3): STGs = LTGs  Skilled Therapeutic Interventions/Progress Updates:    Visit 1: Pain: pt c/o headache, he reported he was premedicated  Pt's mother present and was able to receive education on pt's progress. Pt received in bed and sat to EOB with S, completed squat pivot to w/c with S.  Discussed his home bathroom set up and a w/c with NOT fit so pt will have to ambulate into bathroom at home.  Discussed goals and pt desires to improve his  ambulation skills.  Pt sat in wc to complete all UB self care with set up.  Able to cross legs to reach feet to doff and don socks, but also used a long sponge to thoroughly wash feet.  Pt worked on sit to stand at sink with min A and washed buttocks.  Pt was also able to stand and pull pants over hips with min A and support from sink.  Min A with shoes.  Pt did very well this session. Pt resting in wc with all needs met and his mom was present.    Visit 2: Pain: no c/o pain  Pt received in wc ready for therapy. Pt self propelled wc to gym with his mom present to help with a +2 A for a w/c follow for ambulation.  Pt completed squat pivot to mat with S.  From EOM, worked with push up blocks to lift to stand with S and focused on eccentric lowering to sit slowly to challenge quadricep strength. Pt did well with this exercise.  AROM of LE with knee extension, ankle mobility.  Resisted knee extension with theraband.   Pt then ambulated 40 ft with RW with min A 2x, with a 5 min rest break.  Cues to keep walker close to him.   Pt participated extremely well. Discussed upgrading his goals.  Pt taken back to room, and set up with tray table to prepare for lunch.   All needs met.    Therapy Documentation Precautions:  Precautions Precautions: Cervical, Back, Fall Precaution Booklet Issued: No Required Braces or Orthoses: Cervical Brace, Spinal Brace Cervical Brace: At all times Spinal Brace:  (at all times) Spinal Brace Comments: CTO Restrictions Weight Bearing Restrictions: No      ADL: ADL Eating: Independent Where Assessed-Eating: Wheelchair Grooming: Independent Where Assessed-Grooming: Sitting at sink Upper Body Bathing: Setup Where Assessed-Upper Body Bathing: Sitting at sink Lower Body Bathing: Contact guard Where Assessed-Lower Body Bathing: Sitting at sink, Standing at sink Upper Body Dressing: Setup Where Assessed-Upper Body Dressing: Wheelchair Lower Body Dressing: Minimal  assistance Where Assessed-Lower Body Dressing: Wheelchair Toileting: Minimal assistance Where Assessed-Toileting: Editor, commissioning Method: Squat pivot   Therapy/Group: Individual Therapy  Okie Bogacz 03/12/2020, 12:51 PM

## 2020-03-12 NOTE — Progress Notes (Signed)
Physical Therapy Session Note  Patient Details  Name: Mike Sexton MRN: [id number] Date of Birth: 05/30/1991  Today's Date: 03/12/2020 PT Individual Time: 9476-5465 PT Individual Time Calculation (min): 70 min   Short Term Goals: Week 3:  PT Short Term Goal 1 (Week 3): STG = LTG due to estimated d/c date.  Skilled Therapeutic Interventions/Progress Updates:  Pt received in room with mother present. Video interpreter utilized for session. Discussed home set up & pt has 2 consecutive steps that end on porch + 1 6" step into home without rails & it will be difficult to install them. PT dons R GRAFO for increased R knee support max assist. In gym, PT provides education/demo re: stair negotiation backwards with RW. Pt then performs 2 step negotiation backwards with RW & PT providing min assist in back, rehab tech providing assistance stabilizing & moving RW with pt requiring min assist overall with no LOB or knee buckling noted. After resting pt completed task again. Pt ambulates 100 ft + 32 ft with RW & min assist fading to CGA with w/c follow for safety with no knee buckling noted. Pt performs standing cone taps with BUE support on RW & mirror to see cones 2/2 inability to flex neck 2/2 precautions with focus on BLE hip flexor strengthening, dynamic balance, weight shifting L<>R, and coordination/NMR. Educated pt & mother on anticipated DME, pt to be ambulatory at home with RW & need for hands on assist, reviewed home safety (removing throw rugs), and back/cervical precautions. Also reviewed w/c parts management with pt's mother return demonstrating & pt requiring cuing for locking brakes throughout session. Pt completes squat pivot w/c>Bed with supervision and sit>supine with supervision. Pt left in bed with alarm set, call bell in reach, mother present in room.  Therapy Documentation Precautions:  Precautions Precautions: Cervical, Back, Fall Precaution Booklet Issued: No Required  Braces or Orthoses: Cervical Brace, Spinal Brace Cervical Brace: At all times Spinal Brace:  (at all times) Spinal Brace Comments: CTO Restrictions Weight Bearing Restrictions: No  Pain: C/o unrated HA but reports he's premedicated   Therapy/Group: Individual Therapy  Sandi Mariscal 03/12/2020, 4:07 PM
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
## 2020-03-13 ENCOUNTER — Inpatient Hospital Stay (HOSPITAL_COMMUNITY): Payer: Self-pay | Admitting: Occupational Therapy

## 2020-03-13 ENCOUNTER — Inpatient Hospital Stay (HOSPITAL_COMMUNITY): Payer: Self-pay

## 2020-03-13 NOTE — Progress Notes (Signed)
Physical Therapy Session Note  Patient Details  Name: Mike Sexton MRN: [id number] Date of Birth: [birth year]  Today's Date: 03/13/2020 PT Individual Time: 9767-3419 PT Individual Time Calculation (min): 62 min   Short Term Goals: Week 2:  PT Short Term Goal 1 (Week 2): Pt will intiate stair training with max assist +1. PT Short Term Goal 1 - Progress (Week 2): Met PT Short Term Goal 2 (Week 2): Pt will complete car transfer with mod assist +1. PT Short Term Goal 2 - Progress (Week 2): Met PT Short Term Goal 3 (Week 2): Pt will ambulate 50 ft with LRAD & mod assist +1. PT Short Term Goal 3 - Progress (Week 2): Met Week 3:  PT Short Term Goal 1 (Week 3): STG = LTG due to estimated d/c date.  Skilled Therapeutic Interventions/Progress Updates:    Session focused on stair negotiation with RW for curb step/home entry, functional gait training with RW and R GRAFO, and NMR to address balance, postural control, and RLE coordination/strengthening and forced use.   Pt performed curb step negotiation x 2 reps up/down with overall min assist. Initial cues for sequencing and then able to return demonstrate without cues. Discussed set up again, and pt does report there are actually 2 steps back to back and then the curb step up into the doorway into the house. Will continue plan to practice with family tomorrow (pt would like to try HHA with sisters instead of backwards with RW).   NMR including kicking soccer ball with RLE and then LLE to promote forced use of single limb stance on R with min assist for balance and demonstrates good coordination of RLE. No episodes of knee instability noted. Progressed to throwing and catching ball with BUE (no UE support) with min assist again for balance overall and no episodes of instability or significant LOB. As fatigued, pt requests seated rest breaks and at times this still continues to be extended (~5 min between activities). Progressed to continued  ball toss and weightshifting activity with 2" step under LLE. Despite fatigue this round, pt able to maintain standing and perform ball toss ~ 5 min total.  Functional gait training x 80' with RW and R GRAFO with overall CGA and pt able to direct when rest break needed. Demonstrates improved overall placement and control of RLE during gait. Continues with slow cadence which increases fall risk.  End of session transferred back to back with CGA without AD and returned to supine without assistance. Pt able to manipulate CTO lock/unlock feature during this transition.   Therapy Documentation Precautions:  Precautions Precautions: Cervical, Back, Fall Precaution Booklet Issued: No Required Braces or Orthoses: Cervical Brace, Spinal Brace Cervical Brace: At all times Spinal Brace:  (at all times) Spinal Brace Comments: CTO Restrictions Weight Bearing Restrictions: No Pain:  No complaints.    Therapy/Group: Individual Therapy  Canary Brim Ivory Broad, PT, DPT, CBIS  03/13/2020, 3:21 PM

## 2020-03-13 NOTE — Progress Notes (Signed)
Occupational Therapy Session Note  Patient Details  Name: Mike Sexton [place] MRN: [id number] Date of Birth: 1991-04-07  Today's Date: 03/13/2020 OT Individual Time: 1100-1200 OT Individual Time Calculation (min): 60 min    Short Term Goals: Week 1:  OT Short Term Goal 1 (Week 1): patient will roll in bed with CS, complete side lying to/from sitting with min A OT Short Term Goal 1 - Progress (Week 1): Met OT Short Term Goal 2 (Week 1): patient will tolerate sitting edge of bed for adl tasks with CS for balance OT Short Term Goal 2 - Progress (Week 1): Met OT Short Term Goal 3 (Week 1): patient will complete UB bathing and dressing with min A OT Short Term Goal 3 - Progress (Week 1): Met OT Short Term Goal 4 (Week 1): patient will complete LB bathing and dressing with mod A using ADs as needed OT Short Term Goal 4 - Progress (Week 1): Met OT Short Term Goal 5 (Week 1): patient will complete sit pivot transfer with mod A OT Short Term Goal 5 - Progress (Week 1): Met Week 2:  OT Short Term Goal 1 (Week 2): Pt will don UB clothing with (S) OT Short Term Goal 1 - Progress (Week 2): Met OT Short Term Goal 2 (Week 2): Pt will use AE PRN to don pants with min A OT Short Term Goal 2 - Progress (Week 2): Met OT Short Term Goal 3 (Week 2): Pt will complete toileting tasks with min A OT Short Term Goal 3 - Progress (Week 2): Met OT Short Term Goal 4 (Week 2): Pt will complete toileting transfer with CGA OT Short Term Goal 4 - Progress (Week 2): Other (comment) (Met goal from a Wc level.  Needs to work on from a RW level.) Week 3:  OT Short Term Goal 1 (Week 3): STGs = LTGs  Skilled Therapeutic Interventions/Progress Updates:    Pt seen this session with interpretor present.  Pt worked on ambulation with RW from wc in room to toilet, sat down, stood up and walked back to wc all with CGA.  Tried a second time from w/c directly to toilet for a squat pivot which pt was able to do with close  S.  Recommended pt use a squat pivot with nursing staff and maybe by this weekend use the RW to ambulate to toilet.    Pt self propelled to gym. Standing balance exercises: At table standing - worked on [employer] pipe tree designs standing for 5 min at a time, 3x without BUE support In parallel bars -  Alt stepping feet forward and back Reaching overhead Swaying hips side to side  Seated UE strength and conditioning with 3 # dowel bar with chest press, sh press, and rows 15 x 3   Pt self propelled back to room. Set up with belt alarm and all needs met.    Therapy Documentation Precautions:  Precautions Precautions: Cervical, Back, Fall Precaution Booklet Issued: No Required Braces or Orthoses: Cervical Brace, Spinal Brace Cervical Brace: At all times Spinal Brace:  (at all times) Spinal Brace Comments: CTO Restrictions Weight Bearing Restrictions: No    Vital Signs: Therapy Vitals Temp: 98 F (36.7 C) Pulse Rate: 61 Resp: 16 BP: (!) 98/56 Patient Position (if appropriate): Lying Oxygen Therapy SpO2: 97 % O2 Device: Room Air Pain: Pain Assessment Pain Scale: 0-10 Pain Score: 4  ADL: ADL Eating: Independent Where Assessed-Eating: Wheelchair Grooming: Independent Where Assessed-Grooming: Sitting at sink Upper  Body Bathing: Setup Where Assessed-Upper Body Bathing: Sitting at sink Lower Body Bathing: Contact guard Where Assessed-Lower Body Bathing: Sitting at sink, Standing at sink Upper Body Dressing: Setup Where Assessed-Upper Body Dressing: Wheelchair Lower Body Dressing: Minimal assistance Where Assessed-Lower Body Dressing: Wheelchair Toileting: Minimal assistance Where Assessed-Toileting: Editor, commissioning Method: Squat pivot     Therapy/Group: Individual Therapy  [place] 03/13/2020, 8:32 AM

## 2020-03-13 NOTE — Progress Notes (Signed)
Physical Therapy Session Note  Patient Details  Name: Mike Sexton MRN: [id number] Date of Birth: [birth year]  Today's Date: 03/13/2020 PT Individual Time: 0900-1000 PT Individual Time Calculation (min): 60 min   Short Term Goals: Week 2:  PT Short Term Goal 1 (Week 2): Pt will intiate stair training with max assist +1. PT Short Term Goal 1 - Progress (Week 2): Met PT Short Term Goal 2 (Week 2): Pt will complete car transfer with mod assist +1. PT Short Term Goal 2 - Progress (Week 2): Met PT Short Term Goal 3 (Week 2): Pt will ambulate 50 ft with LRAD & mod assist +1. PT Short Term Goal 3 - Progress (Week 2): Met Week 3:  PT Short Term Goal 1 (Week 3): STG = LTG due to estimated d/c date.  Skilled Therapeutic Interventions/Progress Updates:    Functional bed mobility with use of rail at supervision level. Needs assist with unlocking/locking brace during mobility including bed mobility and transfers. Max assist to don shoes and AFO in preparation for OOB. CGA for sit > stand with RW for support and CGA for balance in standing while performing self "movement" to wake up his legs due to being in bed for several hours. CGA during transfer to w/c and assist for locking/unlocking CTO. Pt performed grooming and oral hygiene at sink mod I from w/c level. Pt propels w/c to and from therapy gym for general strengthening and endurance at mod I level with BUE. Basic transfers throughout session with CGA overall with RW and R GRAFO with occasional cues for foot placement and safety. Practiced gait with RW and R GRAFO with focus on self monitoring fatigue level without being followed by w/c. Educated patient that at home, he will not have a w/c following him so want to start having him self monitor and find his limitations. Pt in agreement and understanding. Pt was able to gait x 50' with overall CGA to min assist with RW with occasional cues for positioning of RW and demonstrated good control  and placement of RLE with extra time and focus. Discussed home set-up and gait distances needed to complete and pt reports it would be longest about what he did this morning. Discussed furniture transfers and home environment and pt reports he has practiced these in therapy. Focused on stair negotiation with use of rails for NMR for RLE up/down 2 steps x 2 reps. Discussed home set up and pt describes it more as platform steps. Will plan to practice that this afternoon and primary PT is scheduled tomorrow for family education to clarify situation and do hands on.   Therapy Documentation Precautions:  Precautions Precautions: Cervical, Back, Fall Precaution Booklet Issued: No Required Braces or Orthoses: Cervical Brace, Spinal Brace Cervical Brace: At all times Spinal Brace:  (at all times) Spinal Brace Comments: CTO Restrictions Weight Bearing Restrictions: No  Pain:  Premedicated for slight headache. Did not rate.    Therapy/Group: Individual Therapy  Canary Brim Ivory Broad, PT, DPT, CBIS  03/13/2020, 12:09 PM

## 2020-03-13 NOTE — Progress Notes (Signed)
Recreational Therapy Session Note  Patient Details  Name: Mike Sexton MRN: [id number] Date of Birth: [birth year] Today's Date: 03/13/2020  Pain: no c/o Skilled Therapeutic Interventions/Progress Updates: Session focused on activity tolerance, dynamic standing balance during co-treat with PT.  Pt stood with RW for soccer related activities, kicking a ball with with RLE and then with his LLE to promote weightbearing and strengthening on the R.  Progressed to standing while stopping the ball with his RLE before kicking it with min assist.  Pt without LOB throughout.  Transitioned to standing to toss/catch a ball with min assist while LLE on 2" block.  Pt without LOB with this activity as well.  Pt did require seated rest breaks throughout the session up to 5 minutes before feeling ready to begin the next round.    Therapy/Group: Co-Treatment Mathew Storck 03/13/2020, 3:41 PM
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
## 2020-03-14 ENCOUNTER — Inpatient Hospital Stay (HOSPITAL_COMMUNITY): Payer: Self-pay | Admitting: Physical Therapy

## 2020-03-14 ENCOUNTER — Encounter (HOSPITAL_COMMUNITY): Payer: Self-pay | Admitting: Occupational Therapy

## 2020-03-14 ENCOUNTER — Ambulatory Visit (HOSPITAL_COMMUNITY): Payer: Self-pay | Admitting: Physical Therapy

## 2020-03-14 ENCOUNTER — Inpatient Hospital Stay (HOSPITAL_COMMUNITY): Payer: No Typology Code available for payment source

## 2020-03-14 NOTE — Progress Notes (Signed)
Physical Therapy Session Note  Patient Details  Name: Mike Sexton MRN: [id number] Date of Birth: 01-20-1991  Today's Date: 03/14/2020 PT Individual Time: 1101-1203 and [id number] PT Individual Time Calculation (min): 62 min and 68 min  Short Term Goals: Week 3:  PT Short Term Goal 1 (Week 3): STG = LTG due to estimated d/c date.  Skilled Therapeutic Interventions/Progress Updates:  Treatment 1: Pt received in room with sister [name]) & sister's boyfriend Jill Alexanders) present for caregiver training. Pt propels w/c with BUE & set up assist, PT reviewed w/c parts management with pt & family. Reviewed stair negotiation backwards with RW with pt performing task with assist from PT & sister with PT reviewing compensatory pattern & RW management. Jill Alexanders reports he may be able to install rails at home but pt will only be able to hold 1 at a time 2/2 width with PT recommending they install R ascending rail. Provided demo & instruction for stair negotiation laterally with R rail & pt return demos with min assist reporting this feels more challenging but this could be due to new way of negotiating stairs. Pt negotiates 6" curb with RW & min assist from PT then from sister. Jill Alexanders plans to look at rail/stair situation & let covering PT know what he was able to install on Monday. Pt completes ambulatory car transfer with assist from sister with PT reviewing safety (I.e. hand placement on seat, not holding to door) with pt using BUE to assist BLE in/out of car. Pt ambulates 115 ft with RW & CGA from sister with R GRAFO & no buckling noted. At end of session pt left in w/c with family present to supervise, call bell in reach. Also reviewed caregiver positioning in relation to pt & need to use gait belt for hands on assist at all times for safety. Pain: denies pain  Treatment 2: Pt received in w/c & agreeable to tx. W/c mobility with BUE & mod I throughout unit. Sit<>stand with CGA & pt engages in Wii  bowling with 1UE support on RW with CGA<>close supervision with focus on standing balance & tolerance with pt only requiring 1 seated rest break 2/2 fatigue & no LOB noted. Pt utilized nu-step on level 5 x12 minutes with BLE only for BLE strengthening & NMR with pt reporting discomfort from brace & PT attempting to assist with repositioning. Sit<>stand with CGA & pt ambulates 40 ft + 80 ft with RW & CGA with seated rest breaks between bouts with pt demonstrating decreased step length, decreased stride length, & decreased gait speed. At end of session pt left in w/c with chair alarm donned, call bell in reach. Pain: "a little" HA - pain meds requested at end of session  Therapy Documentation Precautions:  Precautions Precautions: Cervical, Back, Fall Precaution Booklet Issued: No Required Braces or Orthoses: Cervical Brace, Spinal Brace Cervical Brace: At all times Spinal Brace:  (at all times) Spinal Brace Comments: CTO Restrictions Weight Bearing Restrictions: No    Therapy/Group: Individual Therapy  Sandi Mariscal 03/14/2020, 3:25 PM
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
## 2020-03-14 NOTE — Progress Notes (Signed)
Tonight, when this nurse went into the room to give patient his night time medications, he asked to go to the restroom. Patient was assisted with the rolling walker to the restroom & noted that his gown was wet. When he was seated, went to his bed & his bed was soiled. Unknown if he had an incontinent episode, spilled the urinal or if he knew he was wet while laying in bed. He did not use the call bell to call for toileting needs. There was urine in one of his urinals hanging on the side of the bed & his shorts were intact. He did not say that he wet his bed when he wanted to ambulate. He is able to make some of his needs known. His bed was changed & he was assisted with hygiene care as of set up & handing him washcloths. He saw that a new set of shorts was aquired for him & started to remove his other shorts when he asked for them. He still c/o a headache level 6/10 when asked about his pain. He was taken to radiology for studies & the radiologist was given the on call provider's contact info. No acute distress was noted. Will continue to monitor for changes.

## 2020-03-14 NOTE — Progress Notes (Signed)
Occupational Therapy Session Note  Patient Details  Name: Mike Sexton MRN: [id number] Date of Birth: Dec 12, 1990  Today's Date: 03/14/2020 OT Individual Time: 0900-1000 OT Individual Time Calculation (min): 60 min    Short Term Goals: Week 3:  OT Short Term Goal 1 (Week 3): STGs = LTGs  Skilled Therapeutic Interventions/Progress Updates:    Pt seen for family education with 2 of his sisters and his brother in law. During this session, demonstrated only as during the next PT session family can practice hands on.  Pt did not want to demonstrate the actual toileting tasks or B/d in front of his sisters as his dad and mom will be the ones assisting him with those tasks.   We did have him demonstrate sit to stand and ambulating in and out of the bathroom with sit to stand from [facility] over toilet using RW. Pt only requires contact guard. Discussed the need for a BSC and that SW is looking into coverage for a BSC and a TTB (for future use).  If not covered, provided ideas of where to obtain.   Demonstrated to sisters an easy way to assist him with donning/doffing R shoe with AFO.  With all 3 family members, pt demonstrated his ability to walk with RW with CGA, standing exercises of hip sways, heel raises, knee lifts, and overhead reaching.  Seated hip flexion AROM.  These are exercises he can continue at home.   Pt participated well and family did not have any other concerns or questions regarding self care or DME needs.    Pt resting in wc with all needs met with family present.     Therapy Documentation Precautions:  Precautions Precautions: Cervical, Back, Fall Precaution Booklet Issued: No Required Braces or Orthoses: Cervical Brace, Spinal Brace Cervical Brace: At all times Spinal Brace:  (at all times) Spinal Brace Comments: CTO Restrictions Weight Bearing Restrictions: No   Pain: min c/o pain in chest, lidocane patch applied   ADL: ADL Eating: Independent Where  Assessed-Eating: Wheelchair Grooming: Independent Where Assessed-Grooming: Sitting at sink Upper Body Bathing: Setup Where Assessed-Upper Body Bathing: Sitting at sink Lower Body Bathing: Contact guard Where Assessed-Lower Body Bathing: Sitting at sink, Standing at sink Upper Body Dressing: Setup Where Assessed-Upper Body Dressing: Wheelchair Lower Body Dressing: Minimal assistance Where Assessed-Lower Body Dressing: Wheelchair Toileting: Contact guard Where Assessed-Toileting: Editor, commissioning Method: Ambulating  Therapy/Group: Individual Therapy  [place] 03/14/2020, 12:28 PM

## 2020-03-15 ENCOUNTER — Inpatient Hospital Stay (HOSPITAL_COMMUNITY): Payer: Self-pay | Admitting: Occupational Therapy

## 2020-03-15 ENCOUNTER — Inpatient Hospital Stay (HOSPITAL_COMMUNITY): Payer: Self-pay | Admitting: Physical Therapy
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
## 2020-03-15 NOTE — Progress Notes (Signed)
Occupational Therapy Session Note  Patient Details  Name: Jonanthan Sabet MRN: [id number] Date of Birth: 04/04/1991  Today's Date: 03/15/2020 :  - Individual Therapytime 750-820 Totoal 30 individual minutes    Short Term Goals: Week 2:  OT Short Term Goal 1 (Week 2): Pt will don UB clothing with (S) OT Short Term Goal 1 - Progress (Week 2): Met OT Short Term Goal 2 (Week 2): Pt will use AE PRN to don pants with min A OT Short Term Goal 2 - Progress (Week 2): Met OT Short Term Goal 3 (Week 2): Pt will complete toileting tasks with min A OT Short Term Goal 3 - Progress (Week 2): Met OT Short Term Goal 4 (Week 2): Pt will complete toileting transfer with CGA OT Short Term Goal 4 - Progress (Week 2): Other (comment) (Met goal from a Wc level.  Needs to work on from a RW level.)  Skilled Therapeutic Interventions/Progress Updates: Overall focus this session as follows:  Patient asleep upon approach and agreed to participate in therapy.  Between OT Practitioner and patient's small bit of understanding of English and Spanish Google translate, communication proceeded and was part of the therapeutic occupational therapy process for this patient.  Supine to EOB transfer = extra time and close S;         EOB to w/c transferring to his left with a squat pivot and good balance= CGA                       Bathing sit to stand= upper body only as patient did not want to change pants or wash periare this short session = setup (hosptial gown and he kept on his shorts.   Sit to stand= CGA to min A as energy wanned.  Patient able to complete oral care with S.    At end of session, he was able to set up breakfast tray .    Call bell and phone within reach of patient and his chair alarm was engaged.  Continue plan of care     Therapy Documentation Precautions:  Precautions Precautions: Cervical, Back, Fall Precaution Booklet Issued: No Required Braces or Orthoses: Cervical Brace, Spinal  Brace Cervical Brace: At all times Spinal Brace:  (at all times) Spinal Brace Comments: CTO Restrictions Weight Bearing Restrictions: No Pain: none indicated but grimaced when he initially moved his legs to transfer supine to edge of bed.  He denied need for pain meds      Therapy/Group: Individual Therapy  Pickett, Robyn Yeary 03/15/2020, 10:21 AM 

## 2020-03-15 NOTE — Progress Notes (Signed)
Occupational Therapy Session Note  Patient Details  Name: Mike Sexton MRN: [id number] Date of Birth: 1990-08-06  Today's Date: 03/15/2020 OT Individual Time: 1110-1210 OT Individual Time Calculation (min): 60 min    Short Term Goals: Week 3:  OT Short Term Goal 1 (Week 3): STGs = LTGs  Skilled Therapeutic Interventions/Progress Updates: Patient focus this session:   Functional mobility for self care and IADLs.  Toilet transfer via RW in room  CGA, especially ambulating up and down the incline/decline bathroom threshold (right lower extremity become a bit wobbly without full motor control); functional ambulation in room to gaher personal items and move about = CGA.   Patient able to complete seated weight bearing through right lower extremity to reach up and out for strengthening right upper extremity.  As well, he exhibited good endurance to complete dynamic balance activities (holding on one handed or momentarily not at all onto walker) in order to increas independence and functional self care tasks.    Patient able to don left shoe but required total assist for right.   He stated his brace hurt his chest to lean forward toward shoe.  His cousin was present for interpreting.  Patient was left seated in w/c waiting for lunch at the session's end.   Call bell in place and chair alarm engaged. His cousin wa spresent.     Continue OT Plan of Care     Therapy Documentation Precautions:  Precautions Precautions: Cervical, Back, Fall Precaution Booklet Issued: No Required Braces or Orthoses: Cervical Brace, Spinal Brace Cervical Brace: At all times Spinal Brace:  (at all times) Spinal Brace Comments: CTO Restrictions Weight Bearing Restrictions: No General:   Vital Signs: Therapy Vitals Temp: 97.6 F (36.4 C) Pulse Rate: 64 Resp: 17 BP: (!) 103/59 Patient Position (if appropriate): Sitting Oxygen Therapy SpO2: 100 % O2 Device: Room Air Pain:    ADL:   Therapy/Group: Individual Therapy  Bud Face [facility] 03/15/2020, 3:56 PM

## 2020-03-15 NOTE — Progress Notes (Signed)
Physical Therapy Session Note  Patient Details  Name: Mike Sexton MRN: [id number] Date of Birth: 1991-06-03  Today's Date: 03/15/2020 PT Individual Time: 2379-9094 PT Individual Time Calculation (min): 56 min   Short Term Goals: Week 3:  PT Short Term Goal 1 (Week 3): STG = LTG due to estimated d/c date.  Skilled Therapeutic Interventions/Progress Updates:   Pt received sitting in WC and agreeable to PT. PT utilzed video monitor interpreter throughout session. PT assisted pt to don shoes and R AFO.  WC mobility with distant supeivsion assist to day room x 133f with cues for safety and improve turning technique through tight spaces.   Gait training in day room with CA from PT x [id number] with step-to gait pattern, mild improvement in step through with cues from PT.   PT instructed p tin dynamic balance training while engaged in Wii resort fin/gross motor games of speed slice, sword battle and bowling x 100pins. Pt able to remain standing throughout each activity and then 10 frames of bowling LUE supported on RW throughout and supervision assist from PT for safety. No LOB noted.   Throughout session, pt performed all sit<>stand transfers with supervision assist and RW x 10. [facility] returned to [facility] and [facility] reach and all needs met.        Therapy Documentation Precautions:  Precautions Precautions: Cervical, Back, Fall Precaution Booklet Issued: No Required Braces or Orthoses: Cervical Brace, Spinal Brace Cervical Brace: At all times Spinal Brace:  (at all times) Spinal Brace Comments: CTO Restrictions Weight Bearing Restrictions: (P) No   Pain: denies  Therapy/Group: Individual Therapy  AuLorie Phenix/18/2021, 11:06 AM
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
## 2020-03-16 NOTE — Plan of Care (Signed)
°  Problem: Consults Goal: RH BRAIN INJURY PATIENT EDUCATION Description: Description: See Patient Education module for eduction specifics Outcome: Progressing Goal: Skin Care Protocol Initiated - if Braden Score 18 or less Description: If consults are not indicated, leave blank or document N/A Outcome: Progressing   Problem: RH BLADDER ELIMINATION Goal: RH STG MANAGE BLADDER WITH ASSISTANCE Description: STG Manage Bladder With mod I Assistance Outcome: Progressing   Problem: RH SKIN INTEGRITY Goal: RH STG MAINTAIN SKIN INTEGRITY WITH ASSISTANCE Description: STG Maintain Skin Integrity With mod I Assistance. Outcome: Progressing

## 2020-03-17 ENCOUNTER — Ambulatory Visit (HOSPITAL_COMMUNITY): Payer: Self-pay

## 2020-03-17 ENCOUNTER — Inpatient Hospital Stay (HOSPITAL_COMMUNITY): Payer: Self-pay | Admitting: Occupational Therapy

## 2020-03-17 ENCOUNTER — Encounter (HOSPITAL_COMMUNITY): Payer: Self-pay | Admitting: Occupational Therapy

## 2020-03-17 LAB — BASIC METABOLIC PANEL
Anion gap: 13 (ref 5–15)
BUN: 7 mg/dL (ref 6–20)
CO2: 22 mmol/L (ref 22–32)
Calcium: 9.2 mg/dL (ref 8.9–10.3)
Chloride: 105 mmol/L (ref 98–111)
Creatinine, Ser: 0.74 mg/dL (ref 0.61–1.24)
GFR calc Af Amer: >60 mL/min (ref >60)
GFR calc non Af Amer: >60 mL/min (ref >60)
Glucose, Bld: 158 mg/dL — ABNORMAL HIGH (ref 70–99)
Potassium: 3.3 mmol/L — ABNORMAL LOW (ref 3.5–5.1)
Sodium: 140 mmol/L (ref 135–145)

## 2020-03-17 LAB — CBC
HCT: 37.8 % — ABNORMAL LOW (ref 39.0–52.0)
Hemoglobin: 11.9 g/dL — ABNORMAL LOW (ref 13.0–17.0)
MCH: 28.6 pg (ref 26.0–34.0)
MCHC: 31.5 g/dL (ref 30.0–36.0)
MCV: 90.9 fL (ref 80.0–100.0)
Platelets: 442 10*3/uL — ABNORMAL HIGH (ref 150–400)
RBC: 4.16 MIL/uL — ABNORMAL LOW (ref 4.22–5.81)
RDW: 12.9 % (ref 11.5–15.5)
WBC: 5.5 10*3/uL (ref 4.0–10.5)
nRBC: 0 % (ref 0.0–0.2)

## 2020-03-17 MED ORDER — BETHANECHOL CHLORIDE 10 MG PO TABS
10.0000 mg | ORAL_TABLET | Freq: Three times a day (TID) | ORAL | Status: AC
  Administered 2020-03-17 – 2020-03-19 (×6): 10 mg via ORAL
  Filled 2020-03-17 (×6): qty 1

## 2020-03-17 MED ORDER — POTASSIUM CHLORIDE CRYS ER 20 MEQ PO TBCR
20.0000 meq | EXTENDED_RELEASE_TABLET | Freq: Every day | ORAL | Status: DC
  Administered 2020-03-17 – 2020-03-21 (×5): 20 meq via ORAL
  Filled 2020-03-17 (×5): qty 1

## 2020-03-17 NOTE — Progress Notes (Signed)
Physical Therapy Session Note  Patient Details  Name: Melina Modena MRN: [id number] Date of Birth: 1991/03/27  Today's Date: 03/17/2020 PT Individual Time: 1300-1400 PT Individual Time Calculation (min): 60 min   Short Term Goals: Week 3:  PT Short Term Goal 1 (Week 3): STG = LTG due to estimated d/c date.  Skilled Therapeutic Interventions/Progress Updates:    Pt agreeable to PT session, mother and sister present for family ed. No rail on stairs at this point. Transfers:  Sit<>stand CGA from mat and w/c- performed with sister who demo safe technique. Discussed technique for guarding with mother to provide instructions for other caregivers when needed. Emphasis on gait belt and close assist. Gait: amb 70 ft X2 with rw - using Rt AFO. Family edu on how/why to check for erythema after brace use. Mother able to don brace with pt. Stairs: up/down curb forward approach with sister assist. Up/down 2 steps with posterior approach with mother/sister providing instructions. Reviewed use of brace including locking/unlocking with pt and family. Following session, pt left in care of OT. Interpreter present throughout session to assist with communication.   Therapy Documentation Precautions:  Precautions Precautions: Cervical, Back, Fall Precaution Booklet Issued: No Required Braces or Orthoses: Cervical Brace, Spinal Brace Cervical Brace: At all times Spinal Brace:  (at all times) Spinal Brace Comments: CTO Restrictions Weight Bearing Restrictions: No Pain: Reports mild pain in Rt chest at wound. Monitored during session.     Therapy/Group: Individual Therapy  Delton See 03/17/2020, 2:15 PM

## 2020-03-17 NOTE — Progress Notes (Signed)
Occupational Therapy Session Note  Patient Details  Name: Mike Sexton MRN: [id number] Date of Birth: 1991/04/13  Today's Date: 03/17/2020 OT Individual Time: 0900-1000   &   1400-1500 OT Individual Time Calculation (min): 60 min   &   60 min   Short Term Goals: Week 3:  OT Short Term Goal 1 (Week 3): STGs = LTGs  Skilled Therapeutic Interventions/Progress Updates:    AM session:   Patient alert, in bed, denies pain.  Interpreter present for session.  Supine to sit CS.  He is able to donn socks and shoes seated edge of bed.  SPT and short distance ambulation with RW CGA.  Completed UB bathing, dressing and grooming w/c level with set up.  Reviewed functional transfers in tub/shower and DME options - mod A to step over edge of tub with grab bars - CGA to pivot in seated position and stand from shower bench - recommend removal of shower doors at home and use of tub bench when cleared to shower.  He is able to propel w/c to/from therapy gym.  Completed nustep x 15 minutes level 5 with LEs only.  He remained seated in w/c at close of session.  Seat belt alarm set and call bell in reach.     PM session:   Family education session.  Patient's mother and sister present for family education session this afternoon with interpreter as well.  Verbal review of home set up, DME, adl completion, positioning, safety with bracing, precautions completed - mother and sister with appropriate questions and demonstrate a good understanding.  Recommend ongoing CS/CGA for ambulation/SPT with RW and for activities in stance.  Patient demonstrated short distance ambulation and SPT to/from tub bench with CGA.  Reviewed recommendation to remove shower doors temporarily to ensure safety once he is cleared to shower - family agreed.  Reviewed and practiced reach and transport of items in the kitchen area using RW - patient able to complete with CGA and occ cues for hand placement.  Mother and sister plan to  attend session again tomorrow - no further questions at this time.  He remained seated in w/c at close of session with call bell and tray table in reach.      Therapy Documentation Precautions:  Precautions Precautions: Cervical, Back, Fall Precaution Booklet Issued: No Required Braces or Orthoses: Cervical Brace, Spinal Brace Cervical Brace: At all times Spinal Brace:  (at all times) Spinal Brace Comments: CTO Restrictions Weight Bearing Restrictions: No  Therapy/Group: Individual Therapy  Barrie Lyme 03/17/2020, 7:32 AM
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
## 2020-03-18 ENCOUNTER — Encounter (HOSPITAL_COMMUNITY): Payer: Self-pay | Admitting: Occupational Therapy

## 2020-03-18 ENCOUNTER — Inpatient Hospital Stay (HOSPITAL_COMMUNITY): Payer: Self-pay | Admitting: Occupational Therapy

## 2020-03-18 ENCOUNTER — Ambulatory Visit (HOSPITAL_COMMUNITY): Payer: Self-pay | Admitting: Physical Therapy

## 2020-03-18 DIAGNOSIS — S0285XA Fracture of orbit, unspecified, initial encounter for closed fracture: Secondary | ICD-10-CM | POA: Insufficient documentation

## 2020-03-18 DIAGNOSIS — G5721 Lesion of femoral nerve, right lower limb: Secondary | ICD-10-CM | POA: Insufficient documentation

## 2020-03-18 DIAGNOSIS — W3400XA Accidental discharge from unspecified firearms or gun, initial encounter: Secondary | ICD-10-CM | POA: Insufficient documentation

## 2020-03-18 MED ORDER — MORPHINE SULFATE ER 15 MG PO TBCR
15.0000 mg | EXTENDED_RELEASE_TABLET | Freq: Every day | ORAL | Status: DC
  Administered 2020-03-19 – 2020-03-21 (×3): 15 mg via ORAL
  Filled 2020-03-18 (×3): qty 1
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
## 2020-03-18 NOTE — Progress Notes (Signed)
Occupational Therapy Session Note  Patient Details  Name: Mike Sexton MRN: [id number] Date of Birth: May 10, 1991  Today's Date: 03/18/2020 OT Individual Time: 0858-1000   &   1300-1415 OT Individual Time Calculation (min): 62 min   &  75 min   Short Term Goals: Week 3:  OT Short Term Goal 1 (Week 3): STGs = LTGs  Skilled Therapeutic Interventions/Progress Updates:    AM session:   Patient in bed, alert, denies pain.  Interpreter present for 80% of session.  Supine to sit with CS, sit to stand, short distance ambulation and transfer to/from bed, w/c, chair CS/CGA with RW.  Completed oral care and UB bathing in stance at sink with CGA/CS.  Able to doff and donn pants, socks and shoes seated with set up, CGA for CM in stance.   Able to propel w/c to/from therapy gym.  Completed nustep level 4, LEs only for 15 minutes.  Completed standing dynamic LB activities with CGA and bilateral UE support.   He remained seated in w/c at close of session, seat belt alarm set and call bell in reach.     PM session:   Patient seated in w/c, mom and sister present for therapy session.  Interpreter present for 90% of session.  Patient denies pain and able to relay conversation with MD from this am.  Reviewed adl routine and recommendation for seated activities and CS/CGA when in stance, set up and positioning opportunities in home environment.  Mother and sister state that they feel comfortable with functional transfers and self care.  Family observed functional transfers, dynamic standing and balance activities - reviewed safety when in stance and back precautions.  dynavision activity in stance - 2 minutes, whole screen - trial 1 = 2.07 sec, trail 2 with shift to left to increase right side challenge = 1.54 sec.  Completed visual motor exercises with brock string - good carryover and able to see "x" - no evidence of suppression this session.  Peripheral fields grossly intact.  He remained seated in w/c  at close of session.   Patient and family state that they feel prepared for discharge later this week.  PT session to start momentarily.      Therapy Documentation Precautions:  Precautions Precautions: Cervical, Back, Fall Precaution Booklet Issued: No Required Braces or Orthoses: Cervical Brace, Spinal Brace Cervical Brace: At all times Spinal Brace:  (at all times) Spinal Brace Comments: CTO Restrictions Weight Bearing Restrictions: No  Therapy/Group: Individual Therapy  Barrie Lyme 03/18/2020, 7:33 AM

## 2020-03-18 NOTE — Progress Notes (Signed)
Occupational Therapy Session Note  Patient Details  Name: Melina Modena MRN: [id number] Date of Birth: Nov 14, 1990  Today's Date: 03/18/2020 OT Individual Time: 2423-5361 OT Individual Time Calculation (min): 28 min    Short Term Goals: Week 3:  OT Short Term Goal 1 (Week 3): STGs = LTGs  Skilled Therapeutic Interventions/Progress Updates:    Treatment session with focus on BUE strengthening and endurance.  Pt received upright in w/c, no interpreter present for session however pt able to understand simple instructions in English.  Pt propelled w/c to therapy gym without assistance for BUE strengthening and endurance.  Engaged in 3 sets of 15 chest presses, overhead presses, and rows with 3# dowel for one set and then 4# for remaining 2 sets.  Engaged in PNF pattern diagonals in sitting with 2k medicine ball.  Engaged in Zoom ball for endurance while changing directions and angles to modify challenge.  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  Pt returned to room and remained upright in w/c with RN present to administer medications.  Therapy Documentation Precautions:  Precautions Precautions: Cervical, Back, Fall Precaution Booklet Issued: No Required Braces or Orthoses: Cervical Brace, Spinal Brace Cervical Brace: At all times Spinal Brace:  (at all times) Spinal Brace Comments: CTO Restrictions Weight Bearing Restrictions: No Pain:  Pt reports mild pain in chest, RN aware.  Provided meds at end of session.   Therapy/Group: Individual Therapy  Rosalio Loud 03/18/2020, 2:55 PM

## 2020-03-18 NOTE — Progress Notes (Signed)
Physical Therapy Session Note  Patient Details  Name: Mike Sexton MRN: [id number] Date of Birth: Jun 17, 1991  Today's Date: 03/18/2020 PT Individual Time: 1415-1500 PT Individual Time Calculation (min): 45 min   Short Term Goals: Week 3:  PT Short Term Goal 1 (Week 3): STG = LTG due to estimated d/c date.  Skilled Therapeutic Interventions/Progress Updates:    Pt received seated in w/c in room with family (sister and mom) present for hands on education session. Interpreter present during therapy session. Pt with no complaints of pain but does report feeling fatigued this PM. Pt is at mod I level for w/c mobility with use of BUE. Reviewed car transfer and safe transfer technique at simulation SUV height. Pt able to perform transfer with CGA and cues for safety with use of RW. Pt's family able to perform return demonstration of how to assist him with car transfer. Sit to/from supine on real bed in rehab apartment at Supervision level with cues for logroll technique. Discussed using pillows and/or wedge to prop pt up as he feels like cervical collar is choking him when lying supine. Reviewed cervical and back precautions with patient and his family, they demonstrated good understanding of precautions. Ambulation x 70 ft with RW and CGA for balance with use of R AFO, pt's mom able to provide CGA during gait. Pt and his family with no further questions and pt's family demonstrates good understanding and ability to provide assist for pt at home. Pt left seated in w/c in room with family present at end of session.  Therapy Documentation Precautions:  Precautions Precautions: Cervical, Back, Fall Precaution Booklet Issued: No Required Braces or Orthoses: Cervical Brace, Spinal Brace Cervical Brace: At all times Spinal Brace:  (at all times) Spinal Brace Comments: CTO Restrictions Weight Bearing Restrictions: No    Therapy/Group: Individual Therapy   Peter Congo, PT,  DPT  03/18/2020, 5:09 PM
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
## 2020-03-18 NOTE — Progress Notes (Signed)
Orthopedic Tech Progress Note Patient Details:  Mike Sexton Apr 02, 1991 [id number] Called [facility] for AFO consult. Patient ID: Mike Sexton, male   DOB: 04-13-1991, 29 y.o.   MRN: [id number]   Kerry Fort 03/18/2020, 4:04 PM

## 2020-03-18 NOTE — Progress Notes (Addendum)
Patient ID: Mike Sexton, male   DOB: [birth year], 29 y.o.   MRN: [id number]  SW sent HHPT/OT charity referral to [facility]. SW waiting on follow-up.   SW met with pt , pt mother and sister Anderson Malta in room with interpreter present. SW discussed DME, and explained charity process again. SW also discussed charity HHPT/OT and waiting on if pt can be accepted. Sister to call [facility] to provide information so DME can be delivered. SW to provide updates once there is more information on [facility].   Loralee Pacas, MSW, [place] Office: [phone] Cell: [phone] Fax: [phone]

## 2020-03-19 ENCOUNTER — Inpatient Hospital Stay (HOSPITAL_COMMUNITY): Payer: Self-pay | Admitting: Occupational Therapy

## 2020-03-19 ENCOUNTER — Inpatient Hospital Stay (HOSPITAL_COMMUNITY): Payer: Self-pay | Admitting: *Deleted

## 2020-03-19 NOTE — Progress Notes (Signed)
Occupational Therapy Session Note  Patient Details  Name: Mike Sexton MRN: [id number] Date of Birth: 01-01-1991  Today's Date: 03/19/2020 OT Individual Time: 0900-1000 and 1105-1200 OT Individual Time Calculation (min): 60 min and 55 min    Short Term Goals: Week 1:  OT Short Term Goal 1 (Week 1): patient will roll in bed with CS, complete side lying to/from sitting with min A OT Short Term Goal 1 - Progress (Week 1): Met OT Short Term Goal 2 (Week 1): patient will tolerate sitting edge of bed for adl tasks with CS for balance OT Short Term Goal 2 - Progress (Week 1): Met OT Short Term Goal 3 (Week 1): patient will complete UB bathing and dressing with min A OT Short Term Goal 3 - Progress (Week 1): Met OT Short Term Goal 4 (Week 1): patient will complete LB bathing and dressing with mod A using ADs as needed OT Short Term Goal 4 - Progress (Week 1): Met OT Short Term Goal 5 (Week 1): patient will complete sit pivot transfer with mod A OT Short Term Goal 5 - Progress (Week 1): Met Week 2:  OT Short Term Goal 1 (Week 2): Pt will don UB clothing with (S) OT Short Term Goal 1 - Progress (Week 2): Met OT Short Term Goal 2 (Week 2): Pt will use AE PRN to don pants with min A OT Short Term Goal 2 - Progress (Week 2): Met OT Short Term Goal 3 (Week 2): Pt will complete toileting tasks with min A OT Short Term Goal 3 - Progress (Week 2): Met OT Short Term Goal 4 (Week 2): Pt will complete toileting transfer with CGA OT Short Term Goal 4 - Progress (Week 2): Other (comment) (Met goal from a [place] level.  Needs to work on from a [place] level.) Week 3:  OT Short Term Goal 1 (Week 3): STGs = LTGs  Skilled Therapeutic Interventions/Progress Updates:    Visit 1:  No c/o pain Pt received in room with interpretor present.  Assisted pt with donning R shoe with AFO.  Pt did not do self care this session as this session was planned to work on ambulation with the orthotist to assess the AFO.   Pt worked on ambulation in the hallway with the AFO and then without.  Pt tends to do a step together gait pattern which Mike Sexton, the orthotist, was able to notice well.  Cues to fully advance R foot for a smoother step pattern. Overall light CGA for safety with ambulation.  Mike Sexton is going to get an different type of AFO for pt (toe up). Pt then self propelled in wc to gym.  Worked at parallel bars using B hands for support on stand to squat to strengthen LE.  Pt stated his greatest challenge is the lack of sensation he is feeling.   Pt then propelled back to his room to rest until our next session.  Belt alarm on and all needs met.   Visit 2:  No c/o pain Pt, his mom, and the interpretor present. Pt taken to tub room to demonstrate to his mom the tub bench transfers. Pt can not shower at this time and by the time he is able to be without the CTO he may be strong enough to step over the tub wall. If not, the bench is the safest option. Discussed locations on where to obtain.  Pt transferred to mat, attempted sit to stand without UE but to compensate pt  has to "lock out" his R knee to give him support.  So at this time he will still need UE support to power up to stand smoothly and sit with control.   Worked on exercises he can do at home for his shoulders with light hand wts.  Pt needs to do multiple reps to feel any challenge with the light weights, but advised him not to use over 5 lbs until his cervical fractures are completely healed. Also did knee extension AROM with 5 lb ankle weights which pt did not find that challenging.  Ambulated with RW (mom pushed wc behind pt) with CGA for safety.  Pt had much improved R LE step through. Pt ambulated about 100 ft and then requested to sit.  Pt taken back to room with all needs met.  Belt alarm on.    Therapy Documentation Precautions:  Precautions Precautions: Cervical, Back, Fall Precaution Booklet Issued: No Required Braces or Orthoses: Cervical  Brace, Spinal Brace Cervical Brace: At all times Spinal Brace:  (at all times) Spinal Brace Comments: CTO Restrictions Weight Bearing Restrictions: No  Pain: Pain Assessment Pain Score: 0-No pain    Therapy/Group: Individual Therapy  [place] 03/19/2020, 12:31 PM

## 2020-03-19 NOTE — Progress Notes (Signed)
Physical Therapy Session Note  Patient Details  Name: Mike Sexton MRN: [id number] Date of Birth: [birth year]  Today's Date: 03/19/2020 PT Individual Time: 1425-1530 PT Individual Time Calculation (min): 65 min   Short Term Goals: Week 3:  PT Short Term Goal 1 (Week 3): STG = LTG due to estimated d/c date.  Skilled Therapeutic Interventions/Progress Updates:    Pt received seated in w/c in room, agreeable to PT session. No complaints of pain. Interpreter unavailable during therapy session, utilized [employer] video interpreter services throughout session. Pt is mod I for w/c mobility x 150 ft with use of BUE. Reviewed w/c parts with pt and his mom and how to fold up w/c to fit into the trunk of the car. Sit to stand and stand pivot transfer with RW and CGA throughout session. Ambulation with RW through obstacle course navigating across uneven surface (mat with weights underneath), up/down curb step, stepping over obstacles, and kicking soccer ball. Pt requires overall min A for obstacle course navigating with the exception of uneven surface in which he requires mod A for balance. Seated balance with SBA performing alt ball toss/kick with alt LE to work on sitting balance as well as endurance. Seated RLE knee flexion/extension on soccer ball, pt exhibits HS weakness and difficulty with knee flexion initially. Standing alt ball toss/kick with intermittent support on RW for balance and CGA. Ambulation x 100 ft with RW and CGA for balance with use of R AFO. Pt left seated in w/c in room with needs in reach at end of session. Cotreatment session with LRT  Therapy Documentation Precautions:  Precautions Precautions: Cervical, Back, Fall Precaution Booklet Issued: No Required Braces or Orthoses: Cervical Brace, Spinal Brace Cervical Brace: At all times Spinal Brace:  (at all times) Spinal Brace Comments: CTO Restrictions Weight Bearing Restrictions: No    Therapy/Group: Individual  Therapy   Peter Congo, PT, DPT  03/19/2020, 5:24 PM

## 2020-03-19 NOTE — Progress Notes (Addendum)
Patient ID: Mike Sexton, male   DOB: 03/22/1991, 29 y.o.   MRN: [id number]  Pt set up with [facility] medication assistance program.   SW received updates from Teresa/[facility] reporting pt approved for HHPT/OT charity.   SW met with pt in room to provide updates on being accepted for HHPT/OT. SW provided pt with [place] letter and explained program.   SW received updates from [employer] the [facility] and 3in1 BSC was cancelled per family request.   Loralee Pacas, MSW, [place] Office: [phone] Cell: [phone] Fax: [phone]

## 2020-03-19 NOTE — Progress Notes (Signed)
Recreational Therapy Session Note  Patient Details  Name: Mike Sexton MRN: [id number] Date of Birth: [birth year] Today's Date: 03/19/2020  Pain: no c/o Skilled Therapeutic Interventions/Progress Updates: Session focused on activity tolerance, dynamic standing balance, discharge planning, obstacle course negotiation in preparation for community reintegration during co-treat with PT.  Mom present for half of the session observing.  Offered to answer any questions or concerns pt and mother had, and both stated they felt prepared for upcoming discharge and did not have any questions at this time.  Pt propelled w/c on the unit with supervision.  Once in the gym, pt ambulated with RW through an obstacle course stepping on and over various surfaces/items including a curb step with min-mod assist.  Pt required mod assist to ambulate over a mat on the floor with wrist weights underneath simulating outdoor uneven grassy surfaces.  Educated pt and mother on safety concerns with community mobility.  Transitioned to sitting EOM kicking a ball or catching a ball alternately without warning for balance challenge with supervision.  Pt then stood to kick or catch a ball alternately without warning with min assist.    Therapy/Group: Co-Treatment  Bryanah Sidell 03/19/2020, 3:42 PM
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
## 2020-03-20 ENCOUNTER — Inpatient Hospital Stay (HOSPITAL_COMMUNITY): Payer: Self-pay

## 2020-03-20 ENCOUNTER — Inpatient Hospital Stay (HOSPITAL_COMMUNITY): Payer: Self-pay | Admitting: Physical Therapy

## 2020-03-20 ENCOUNTER — Inpatient Hospital Stay (HOSPITAL_COMMUNITY): Payer: Self-pay | Admitting: Occupational Therapy

## 2020-03-20 NOTE — Progress Notes (Signed)
Recreational Therapy Discharge Summary Patient Details  Name: Mike Sexton MRN: [id number] Date of Birth: [birth year] Today's Date: 03/20/2020  Long term goals set: 1  Long term goals met: 1  Comments on progress toward goals: Pt has made excellent progress during LOS and is ready for discharge home with family to provide 24 hour supervision/assistance.  TR sessions focused on activity tolerance, dynamic balance, community mobility, safety awareness and discharge planning.  TR session include modified soccer tasks and negotiating an obstacle course simulating community mobility ambulatory level using RW.  Pt is discharging home at [facility] assist level for standing TR tasks and supervision seated.  Reasons goals not met: n/a  Equipment acquired: n/a  Reasons for discharge: discharge from hospital  Follow-up: [place] agrees with progress made and goals achieved: Yes  [name] 03/20/2020, 1:10 PM

## 2020-03-20 NOTE — Progress Notes (Signed)
Physical Therapy Session Note  Patient Details  Name: Mike Sexton MRN: [id number] Date of Birth: [birth year]  Today's Date: 03/20/2020 PT Individual Time: 0347-4259 PT Individual Time Calculation (min): 56 min   Short Term Goals: Week 3:  PT Short Term Goal 1 (Week 3): STG = LTG due to estimated d/c date.  Skilled Therapeutic Interventions/Progress Updates: Pt presents sitting in w/c and agreeable to therapy.  Pt wheeled self out of room and into hallways up to 150' w/ supervision and BUEs.  Pt requires seated rest break between trials.  Pt transfers SPT w/c <> mat table w/ RW and w/o AD and Supervision.  When transferring w/o RW, pt requires occasional verbal cues for RLE placement as puts as close to w/c as able.  When transfers sit to stand w/ RW and then SPT, he places RLE in correct position to stand.  Pt amb short distance w/ RW to car.  Pt able to transfer w/ Supervision, bringing LEs into care.  Pt amb multiple trials w/ RW up to 56' and supervision w/ proper R foot placement wearing AFO.  Pt performed bed mobility sit > R side-lying and then supine, then log roll to R side and to sit w/ supervision to mod I on flat bed w/o hand rails.  Pt does require reminder to transition to hook-lying position when supine to improve log roll technique.  Pt transferred squat pivot bed > w/c w/ supervision.  Chair alarm on and all needs in reach.  Nursing in room for meds.     Therapy Documentation Precautions:  Precautions Precautions: Fall Precaution Booklet Issued: No Required Braces or Orthoses: Cervical Brace, Spinal Brace Cervical Brace: Hard collar Spinal Brace:  (at all times) Spinal Brace Comments: CTO Restrictions Weight Bearing Restrictions: No General:   Vital Signs:  Pain:5/10 to chest, very little in Head. Pain Assessment Pain Score: 0-No pain    Therapy/Group: Individual Therapy  [name] 03/20/2020, 12:13 PM

## 2020-03-20 NOTE — Progress Notes (Signed)
Physical Therapy Discharge Summary  Patient Details  Name: Mariah Milling [place] MRN: [id number] Date of Birth: [birth year]  Today's Date: 03/20/2020 PT Individual Time: 1415-1530 PT Individual Time Calculation (min): 75 min    Patient has met 10 of 10 long term goals due to improved activity tolerance, improved balance, improved postural control, increased strength, increased range of motion, decreased pain, ability to compensate for deficits and improved coordination.  Patient to discharge at a wheelchair level Modified Independent and ambulating household distances with CGA.   Patient's care partner is independent to provide the necessary physical assistance at discharge.  Reasons goals not met: n/a  Recommendation:  Patient will benefit from ongoing skilled PT services in home health setting to continue to advance safe functional mobility, address ongoing impairments in balance, LE strength/coordination, functional mobility, gait and stair training, activity tolerance, patient/caregiver education, and minimize fall risk.  Equipment: 18"x18" light weight wheelchair, RW  Reasons for discharge: treatment goals met  Patient/family agrees with progress made and goals achieved: Yes  Skilled Therapeutic Intervention: Patient in w/c with his mother in the room upon PT arrival. Patient alert and agreeable to PT session. Patient denied pain during session. Live interpreter present during session until 1515, then [employer] interpretation system use for remaining 15 min of session.   Performed discharge assessment during session, see details below. Discussed d/c planning with patient and his mother. Report that a ramp will be build by d/c tomorrow for home entry. Patient stated that he plans to use the RW inside the home and w/c when outside or in the community. Reviewed energy conservation techniques with patient to prevent fatigue with frequent household ambulation.   Therapeutic  Activity: Bed Mobility: Patient performed rolling R/L and supine to/from sit with supervision in a flat bed without use of bed rails to simulate home set-up. Patient's mother expressed concern about the patient rolling off the bed when sitting up. Demonstrated safe guarding technique to prevent patient from rolling of the bed with mobility.  Transfers: Patient performed sit to/from stand x4 with supervision using RW. Provided verbal cues for reaching back to sit for safety x2.  Gait Training:  Patient ambulated 15 feet using the RW and R AFO in the room with his mother providing CGA to simulate home ambulation. Demonstrated safe technique by both patient and his mother. He also ambulated 116 feet using RW and R AFO with close supervision provided by PT for safety. Ambulated as described below, intermittent L extensor thrust noted with fatigue. Provided verbal cues for erect posture, looking ahead, and B knee control in stance. Patient ascended/descended 4-6" steps using B rails with CGA while ascending and min A while descending due to decreased quad control. Performed step-to gait pattern leading with L while ascending and R while descending. Provided cues and demonstration for technique and sequencing.   Wheelchair Mobility:  Patient propelled wheelchair >150 feet x2 with mod I. Demonstrated donning/doffing leg rests for patient's mother x1, she then preceded to don/doff the leg rests independently throughout session.   Therapeutic Exercise: Patient performed one set of the following exercises provided as HEP handout with verbal and tactile cues for proper technique. All exercises translated to Spanish for patient.  Access Code: [email or web address] Standing Hamstring Curl with Chair Support - 3 x daily - 7 x weekly - 3 sets - 5-10 reps - 2-3 sec hold  Seated Hamstring Curl with band Resistance - 3 x daily - 7 x weekly - 3 sets - 10 reps  Side Stepping with Band Resistance at Thighs - 3 x daily - 7 x weekly -  3 sets - 10 reps  Educated patient on fall risk/prevention, home modifications to prevent falls, and activation of emergency services in the event of a fall during session.   Patient in w/c with his mother in the room at end of session with breaks locked, seat belt alarm set, and all needs within reach.    PT Discharge Precautions/Restrictions Precautions Precautions: Fall Required Braces or Orthoses: Cervical Brace;Spinal Brace Cervical Brace: Hard collar Spinal Brace Comments: CTO Restrictions Weight Bearing Restrictions: No Vital Signs Therapy Vitals Temp: 97.7 F (36.5 C) Temp Source: Oral Pulse Rate: 71 Resp: 18 BP: 108/66 Patient Position (if appropriate): Sitting Oxygen Therapy SpO2: 100 % O2 Device: Room Air Pain Pain Assessment Pain Score: 0-No pain Vision/Perception  Vision - Assessment Eye Alignment: Within Functional Limits Ocular Range of Motion: Within Functional Limits Alignment/Gaze Preference: Within Defined Limits Tracking/Visual Pursuits: Able to track stimulus in all quads without difficulty Saccades: Additional eye shifts occurred during testing Additional Comments: On admission, pt was having significant R eye blurriness. pt reports that has cleared up and no longer has great difficulty, only mild blurriness.  He also reports diplopia has resolved. Perception Perception: Within Functional Limits Praxis Praxis: Intact  Cognition Overall Cognitive Status: Within Functional Limits for tasks assessed Arousal/Alertness: Awake/alert Focused Attention: Appears intact Sustained Attention: Appears intact Selective Attention: Appears intact Memory: Appears intact Awareness: Appears intact Problem Solving: Appears intact Safety/Judgment: Appears intact Sensation Sensation Light Touch: Appears Intact Light Touch Impaired Details:  (B LE light touch intact) Hot/Cold: Impaired Detail (unable to feel painful stimulus on R LE) Hot/Cold Impaired Details:  Absent RLE Proprioception: Impaired by gross assessment Stereognosis: Appears Intact Additional Comments: UB sensation intact; LB very impaired per pt "I can not feel my legs, they feel numb" Coordination Gross Motor Movements are Fluid and Coordinated: No Fine Motor Movements are Fluid and Coordinated: No Coordination and Movement Description: UE coordination WFL,  limited in BLE Finger Nose Finger Test: [facility] Heel Shin Test: variable bilaterally Motor  Motor Motor: Abnormal postural alignment and control Motor - Discharge Observations: generalized weakness (RLE>LLE), decreased balance strategies  Mobility Bed Mobility Bed Mobility: Rolling Right;Rolling Left;Supine to Sit;Sit to Supine Rolling Right: Supervision/verbal cueing Rolling Left: Supervision/Verbal cueing Supine to Sit: Supervision/Verbal cueing Sit to Supine: Supervision/Verbal cueing Transfers Transfers: Sit to Stand;Stand to Sit;Stand Pivot Transfers Sit to Stand: Supervision/Verbal cueing Stand to Sit: Supervision/Verbal cueing Stand Pivot Transfers: Contact Guard/Touching assist Stand Pivot Transfer Details: Verbal cues for safe use of DME/AE;Verbal cues for technique;Verbal cues for precautions/safety Transfer (Assistive device): Rolling walker Locomotion  Gait Ambulation: Yes Gait Assistance: Supervision/Verbal cueing Gait Distance (Feet): 116 Feet Assistive device: Rolling walker;Other (Comment) (R AFO) Gait Assistance Details: Verbal cues for precautions/safety;Manual facilitation for weight shifting;Verbal cues for gait pattern;Verbal cues for technique Gait Gait: Yes Gait Pattern: Step-through pattern;Decreased stride length;Decreased hip/knee flexion - right;Decreased hip/knee flexion - left;Left genu recurvatum;Decreased trunk rotation;Narrow base of support Gait velocity: decreased Stairs / Additional Locomotion Stairs: Yes Stairs Assistance: Minimal Assistance - Patient > 75% Stair Management  Technique: Two rails Number of Stairs: 4 Height of Stairs: 6 Wheelchair Mobility Wheelchair Mobility: Yes Wheelchair Assistance: Independent with Camera operator: Both upper extremities Wheelchair Parts Management: Needs assistance (family independent with w/c parts managment) Distance: >150 ft  Trunk/Postural Assessment  Cervical Assessment Cervical Assessment: Exceptions to [facility] (Cervicalthoracic orthosis) Thoracic Assessment Thoracic Assessment: Exceptions to [facility] (  Cervicalthoracic orthosis) Lumbar Assessment Lumbar Assessment: Exceptions to [facility] Postural Control Postural Control: Deficits on evaluation Righting Reactions: delayed, improved since admission Protective Responses: delayed, improved since admission  Balance Balance Balance Assessed: Yes Static Sitting Balance Static Sitting - Balance Support: No upper extremity supported;Feet supported Static Sitting - Level of Assistance: 6: Modified independent (Device/Increase time) Dynamic Sitting Balance Dynamic Sitting - Level of Assistance: 6: Modified independent (Device/Increase time) Static Standing Balance Static Standing - Balance Support: No upper extremity supported Static Standing - Level of Assistance: 5: Stand by assistance Dynamic Standing Balance Dynamic Standing - Balance Support: No upper extremity supported Dynamic Standing - Level of Assistance: 5: Stand by assistance;4: Min assist Extremity Assessment  RLE Assessment RLE Assessment: Exceptions to [facility] Active Range of Motion (AROM) Comments: Full active ROM throughout General Strength Comments: in sitting: hip flexion 4/5, knee extension 4+/5, knee flexion 3+/5, DF 4/5, PF 4+/5 LLE Assessment LLE Assessment: Within Functional Limits Active Range of Motion (AROM) Comments: WFL General Strength Comments: in sitting: 5/5 throughout    Kirstin Kugler L Gervase Colberg PT, DPT  03/20/2020, 2:50 PM

## 2020-03-20 NOTE — Progress Notes (Signed)
Occupational Therapy Session Note  Patient Details  Name: Mike Sexton MRN: [id number] Date of Birth: 05/19/1991  Today's Date: 03/20/2020 OT Individual Time: 0900-1000 OT Individual Time Calculation (min): 60 min    Short Term Goals: Week 1:  OT Short Term Goal 1 (Week 1): patient will roll in bed with CS, complete side lying to/from sitting with min A OT Short Term Goal 1 - Progress (Week 1): Met OT Short Term Goal 2 (Week 1): patient will tolerate sitting edge of bed for adl tasks with CS for balance OT Short Term Goal 2 - Progress (Week 1): Met OT Short Term Goal 3 (Week 1): patient will complete UB bathing and dressing with min A OT Short Term Goal 3 - Progress (Week 1): Met OT Short Term Goal 4 (Week 1): patient will complete LB bathing and dressing with mod A using ADs as needed OT Short Term Goal 4 - Progress (Week 1): Met OT Short Term Goal 5 (Week 1): patient will complete sit pivot transfer with mod A OT Short Term Goal 5 - Progress (Week 1): Met Week 2:  OT Short Term Goal 1 (Week 2): Pt will don UB clothing with (S) OT Short Term Goal 1 - Progress (Week 2): Met OT Short Term Goal 2 (Week 2): Pt will use AE PRN to don pants with min A OT Short Term Goal 2 - Progress (Week 2): Met OT Short Term Goal 3 (Week 2): Pt will complete toileting tasks with min A OT Short Term Goal 3 - Progress (Week 2): Met OT Short Term Goal 4 (Week 2): Pt will complete toileting transfer with CGA OT Short Term Goal 4 - Progress (Week 2): Other (comment) (Met goal from a Wc level.  Needs to work on from a RW level.) Week 3:  OT Short Term Goal 1 (Week 3): STGs = LTGs  Skilled Therapeutic Interventions/Progress Updates:      Pt seen for BADL retraining of toileting, bathing, and dressing with a focus on pt completing tasks at a S level. Pt received in bed, and was able to get out of bed, ambulate to toilet, toilet, ambulate to arm chair, dress, don shoes and shoe with AFO all with  Supervision.  Pt then worked on ambulation in the hall way with Rw with close S to light CGA with no cues needed for stepping R foot through.  Excellent participation and pt is ready to go home tomorrow.  Resting in chair with all needs met and belt alarm on.    Therapy Documentation Precautions:  Precautions Precautions: Fall Precaution Booklet Issued: No Required Braces or Orthoses: Cervical Brace, Spinal Brace Cervical Brace: Hard collar Spinal Brace:  (at all times) Spinal Brace Comments: CTO Restrictions Weight Bearing Restrictions: No  Pain: Pain Assessment Pain Score: 0-No pain ADL: ADL Eating: Independent Where Assessed-Eating: Wheelchair Grooming: Independent Where Assessed-Grooming: Sitting at sink Upper Body Bathing: Setup Where Assessed-Upper Body Bathing: Sitting at sink Lower Body Bathing: Supervision/safety Where Assessed-Lower Body Bathing: Sitting at sink, Standing at sink Upper Body Dressing: Setup Where Assessed-Upper Body Dressing: Wheelchair Lower Body Dressing: Supervision/safety Where Assessed-Lower Body Dressing: Wheelchair Toileting: Supervision/safety Where Assessed-Toileting: Glass blower/designer: Close supervision Toilet Transfer Method: Ambulating Tub/Shower Transfer: Close supervison Tub/Shower Transfer Method: Ambulating, Squat pivot Tub/Shower Equipment: Transfer tub bench (simulted only as pt can not shower with CTO brace)   Therapy/Group: Individual Therapy  Mike Sexton 03/20/2020, 12:17 PM

## 2020-03-20 NOTE — Progress Notes (Signed)
Occupational Therapy Discharge Summary  Patient Details  Name: Mike Sexton MRN: [id number] Date of Birth: 02-15-1991   Patient has met 47 of 10 long term goals due to improved activity tolerance, improved balance, postural control, ability to compensate for deficits, functional use of  RIGHT lower and LEFT lower extremity and improved coordination.  Patient to discharge at overall Supervision level.  Patient's care partner is independent to provide the necessary physical assistance at discharge.    Reasons goals not met: n/a  Recommendation:  Patient will benefit from ongoing skilled OT services in home health setting to continue to advance functional skills in the area of BADL and iADL.  Equipment: Family will get a  BSC on their own.  Reasons for discharge: treatment goals met  Patient/family agrees with progress made and goals achieved: Yes  OT Discharge Precautions/Restrictions  Precautions Precautions: Fall Required Braces or Orthoses: Cervical Brace;Spinal Brace Cervical Brace: Hard collar Spinal Brace Comments: CTO Restrictions Weight Bearing Restrictions: No  Pain Pain Assessment Pain Score: 0-No pain ADL ADL Eating: Independent Where Assessed-Eating: Wheelchair Grooming: Independent Where Assessed-Grooming: Sitting at sink Upper Body Bathing: Setup Where Assessed-Upper Body Bathing: Sitting at sink Lower Body Bathing: Supervision/safety Where Assessed-Lower Body Bathing: Sitting at sink, Standing at sink Upper Body Dressing: Setup Where Assessed-Upper Body Dressing: Wheelchair Lower Body Dressing: Supervision/safety Where Assessed-Lower Body Dressing: Wheelchair Toileting: Supervision/safety Where Assessed-Toileting: Glass blower/designer: Close supervision Toilet Transfer Method: Ambulating Tub/Shower Transfer: Close supervison Tub/Shower Transfer Method: Ambulating, Squat pivot Tub/Shower Equipment: Transfer tub bench (simulted only as  pt can not shower with CTO brace) Vision Additional Comments: On admission, pt was having significant R eye blurriness. pt reports that has cleared up and no longer has great difficulty, only mild blurriness.  He also reports diplopia has resolved. Perception  Perception: Within Functional Limits Praxis Praxis: Intact Cognition Overall Cognitive Status: Within Functional Limits for tasks assessed Sensation Sensation Light Touch: Impaired Detail Light Touch Impaired Details: Impaired RLE;Impaired LLE Hot/Cold: Appears Intact Proprioception: Impaired by gross assessment Stereognosis: Appears Intact Additional Comments: UB sensation intact; LB very impaired per pt "I can not feel my legs, they feel numb" Coordination Gross Motor Movements are Fluid and Coordinated: No Fine Motor Movements are Fluid and Coordinated: No Coordination and Movement Description: UE coordination WFL,  limited in BLE Finger Nose Finger Test: [facility] Motor  Motor Motor - Discharge Observations: generalized weakness (RLE>LLE) Mobility    close S with toilet transfers, sit to stands, standing during self care with RW for support Trunk/Postural Assessment    pt is able to hold postural control with S during self care Balance Static Sitting Balance Static Sitting - Level of Assistance: 7: Independent Dynamic Sitting Balance Dynamic Sitting - Level of Assistance: 5: Stand by assistance  Static stand: S Dynamic stand: S during self care Extremity/Trunk Assessment RUE Assessment RUE Assessment: Within Functional Limits LUE Assessment LUE Assessment: Within Functional Limits   [name] 03/20/2020, 1:13 PM
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
## 2020-03-21 DIAGNOSIS — E876 Hypokalemia: Secondary | ICD-10-CM

## 2020-03-21 MED ORDER — SENNOSIDES-DOCUSATE SODIUM 8.6-50 MG PO TABS: 2.0000 | ORAL_TABLET | Freq: Every day | ORAL | 0 refills | Status: DC

## 2020-03-21 MED ORDER — NAPHAZOLINE-GLYCERIN 0.012-0.2 % OP SOLN: 2.0000 [drp] | Freq: Three times a day (TID) | OPHTHALMIC | 0 refills | Status: DC

## 2020-03-21 MED ORDER — TRAMADOL HCL 50 MG PO TABS
50.0000 mg | ORAL_TABLET | Freq: Four times a day (QID) | ORAL | 0 refills | Status: DC | PRN
Start: 2020-03-21 — End: 2020-06-10

## 2020-03-21 MED ORDER — MORPHINE SULFATE ER 15 MG PO TBCR: 15.0000 mg | EXTENDED_RELEASE_TABLET | Freq: Every day | ORAL | 0 refills | Status: DC

## 2020-03-21 MED ORDER — TOPIRAMATE 25 MG PO TABS: 25.0000 mg | ORAL_TABLET | Freq: Two times a day (BID) | ORAL | 0 refills | Status: DC

## 2020-03-21 MED ORDER — METHOCARBAMOL 500 MG PO TABS: 500.0000 mg | ORAL_TABLET | Freq: Three times a day (TID) | ORAL | 0 refills | Status: DC

## 2020-03-21 MED ORDER — GABAPENTIN 300 MG PO CAPS: 600.0000 mg | ORAL_CAPSULE | Freq: Three times a day (TID) | ORAL | 0 refills | Status: DC

## 2020-03-21 MED ORDER — LIDOCAINE 5 % EX PTCH: 1.0000 | MEDICATED_PATCH | CUTANEOUS | 0 refills | Status: DC

## 2020-03-21 MED ORDER — POTASSIUM CHLORIDE CRYS ER 20 MEQ PO TBCR: 20.0000 meq | EXTENDED_RELEASE_TABLET | Freq: Every day | ORAL | 0 refills | Status: DC

## 2020-03-21 MED ORDER — TAMSULOSIN HCL 0.4 MG PO CAPS: 0.4000 mg | ORAL_CAPSULE | Freq: Every day | ORAL | 0 refills | Status: DC

## 2020-03-21 MED ORDER — POLYETHYLENE GLYCOL 3350 17 G PO PACK: 17.0000 g | PACK | Freq: Two times a day (BID) | ORAL | 0 refills | Status: DC

## 2020-03-21 MED ORDER — FAMOTIDINE 20 MG PO TABS: 20.0000 mg | ORAL_TABLET | Freq: Two times a day (BID) | ORAL | 0 refills | Status: DC

## 2020-03-21 NOTE — Progress Notes (Signed)
Inpatient Rehabilitation Care Coordinator  Discharge Note  The overall goal for the admission was met for:   Discharge location: Yes. D/c to parent's home with support from parents and siblings.   Length of Stay: Yes. 24 days.   Discharge activity level: Yes. Mod I at w/c level and CGA ambulating household distances.   Home/community participation: Yes. Limited.   Services provided included: MD, RD, PT, OT, RN, CM, TR, Pharmacy, Neuropsych and SW  Financial Services: Other: Uninsured  Follow-up services arranged: Home Health: [facility] for HHPT/OT (charity) and DME: [place] for w/c (charity); family purchased [facility] and [facility] follow-up Appointment at: [facility] and [facility]  Monday, Oct 11 at [place] with Gypsy Balsam, NP [address], [place] [address] [phone]  Comments (or additional information): contact pt sister Anderson Malta [phone]  Patient/Family verbalized understanding of follow-up arrangements: Yes  Individual responsible for coordination of the follow-up plan: Pt to have assistance coordinating care needs.   Confirmed correct DME delivered: Rana Snare 03/21/2020    Rana Snare
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
## 2020-03-21 NOTE — Discharge Instructions (Signed)
Inpatient Rehab Discharge Instructions  Mike Sexton Discharge date and time: 03/21/20   Activities/Precautions/ Functional Status: Activity: no lifting, driving, or strenuous exercise till cleared by MD Diet: regular diet Wound Care: keep wound clean and dry    Functional status:  ___ No restrictions     ___ Walk up steps independently _X__ 24/7 supervision/assistance   ___ Walk up steps with assistance ___ Intermittent supervision/assistance  ___ Bathe/dress independently ___ Walk with walker     ___ Bathe/dress with assistance ___ Walk Independently    ___ Shower independently ___ Walk with assistance    _X__ Shower with assistance _X__ No alcohol     ___ Return to work/school ________    Special Instructions: 1. Wear collar at all times till cleared by Dr. Patric Dykes. 2.  COMMUNITY REFERRALS UPON DISCHARGE:     Home Health:   PT     OT                Agency: [facility] (charity)  Phone: [phone]  *Please expect follow-up within 2-3 days of discharge to schedule your home visit. If you have not received follow-up, be sure to contact the branch directly.   Medical Equipment/Items Ordered: wheelchair, rolling walker                                                 Agency/Supplier: [employer] [phone] (charity)  GENERAL COMMUNITY RESOURCES FOR PATIENT/FAMILY: Hospital follow-up Appointment at: [facility] and [facility]  Monday, Oct 11 at 9am with Delfin Gant, NP [address] [place], [place] [address] [phone] *Please be sure to discuss orange care/financial assistance at time of appointment. If you are unable to make appointment, please be sure to call to reschedule.  My questions have been answered and I understand these instructions. I will adhere to these goals and the provided educational materials after my discharge from the hospital.  Patient/Caregiver Signature  _______________________________ Date __________  Clinician Signature _______________________________________ Date __________  Please bring this form and your medication list with you to all your follow-up doctor's appointments.

## 2020-03-21 NOTE — Progress Notes (Signed)
Patient discharged home with mother and sister. D/C instructions reviewed by Elita Quick, PA. All belongings and equipment sent with patient, escorted out by NT.
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
## 2020-03-25 ENCOUNTER — Telehealth: Payer: Self-pay | Admitting: Registered Nurse

## 2020-03-25 MED ORDER — MORPHINE SULFATE ER 15 MG PO TBCR
15.0000 mg | EXTENDED_RELEASE_TABLET | Freq: Every day | ORAL | 0 refills | Status: DC
Start: 2020-03-25 — End: 2020-03-25

## 2020-03-25 NOTE — Telephone Encounter (Signed)
Transitional Care call Transitional Questions Answered by Ms. Mike Sexton ( Sister)  Patient name: Mike Sexton DOB: 22-Aug-1990 1. Are you/is patient experiencing any problems since coming home? No a. Are there any questions regarding any aspect of care? No 2. Are there any questions regarding medications administration/dosing? No a. Are meds being taken as prescribed? Yes b. "Patient should review meds with caller to confirm" Medication List Reviewed: MS Contin was tapered off: Sister was under the impression he would continue the medication. This provider spoke with Delle Reining Pa-C, the MS Contin was tapered off.  MS Contin discontinued. Ms. Mike Sexton is aware of the above, and verbalizes understanding. He may use Tramadol for pain as needed, she verbalizes understanding.  3. Have there been any falls? No 4. Has Home Health been to the house and/or have they contacted you? [facility] awaiting for [facility] a. If not, have you tried to contact them? No b. Can we help you contact them? No 5. Are bowels and bladder emptying properly? Yes a. Are there any unexpected incontinence issues? No b. If applicable, is patient following bowel/bladder programs? NA 6. Any fevers, problems with breathing, unexpected pain? No Are there any skin problems or new areas of breakdown? No 7. Has the patient/family member arranged specialty MD follow up (ie cardiology/neurology/renal/surgical/etc.)?  Ms. Mike Sexton was instructed to call Dr Wynelle Link and Dr Elijah Birk office to schedule HFU appointments, she has called the remaining HFU appointments.  a. Can we help arrange? No 8. Does the patient need any other services or support that we can help arrange? No 9. Are caregivers following through as expected in assisting the patient? Yes 10. Has the patient quit smoking, drinking alcohol, or using drugs as recommended? (                        )  Appointment date/time 04/02/2020  arrival time 9:20 for  9:40 appointment with Jones Bales ANP-C. At [address] [employer] [facility] [address]

## 2020-04-02 ENCOUNTER — Other Ambulatory Visit: Payer: Self-pay

## 2020-04-02 ENCOUNTER — Encounter: Payer: No Typology Code available for payment source | Attending: Registered Nurse | Admitting: Registered Nurse

## 2020-04-02 VITALS — BP 111/69 | HR 70 | Temp 98.3°F | Ht 65.0 in | Wt 184.0 lb

## 2020-04-02 DIAGNOSIS — G5721 Lesion of femoral nerve, right lower limb: Secondary | ICD-10-CM | POA: Diagnosis not present

## 2020-04-02 DIAGNOSIS — W3400XA Accidental discharge from unspecified firearms or gun, initial encounter: Secondary | ICD-10-CM

## 2020-04-02 DIAGNOSIS — S0285XA Fracture of orbit, unspecified, initial encounter for closed fracture: Secondary | ICD-10-CM
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
## 2020-04-03 ENCOUNTER — Encounter: Payer: Self-pay | Admitting: Registered Nurse

## 2020-04-04 ENCOUNTER — Other Ambulatory Visit: Payer: Self-pay | Admitting: Neurosurgery

## 2020-04-04 DIAGNOSIS — M542 Cervicalgia: Secondary | ICD-10-CM

## 2020-04-07 ENCOUNTER — Inpatient Hospital Stay: Payer: Self-pay | Admitting: Family

## 2020-04-10 ENCOUNTER — Ambulatory Visit: Payer: Self-pay | Attending: Family | Admitting: Internal Medicine

## 2020-04-10 ENCOUNTER — Encounter: Payer: Self-pay | Admitting: Internal Medicine

## 2020-04-10 ENCOUNTER — Other Ambulatory Visit: Payer: Self-pay

## 2020-04-10 DIAGNOSIS — G5711 Meralgia paresthetica, right lower limb: Secondary | ICD-10-CM

## 2020-04-10 DIAGNOSIS — E876 Hypokalemia: Secondary | ICD-10-CM

## 2020-04-10 DIAGNOSIS — S0281XD Fracture of other specified skull and facial bones, right side, subsequent encounter for fracture with routine healing: Secondary | ICD-10-CM

## 2020-04-10 DIAGNOSIS — S0285XD Fracture of orbit, unspecified, subsequent encounter for fracture with routine healing: Secondary | ICD-10-CM

## 2020-04-10 MED ORDER — POLYETHYLENE GLYCOL 3350 17 G PO PACK
17.0000 g | PACK | Freq: Every day | ORAL | 0 refills | Status: DC | PRN
Start: 2020-04-10 — End: 2020-06-10

## 2020-04-10 MED ORDER — LIDOCAINE 5 % EX PTCH
1.0000 | MEDICATED_PATCH | Freq: Every day | CUTANEOUS | 0 refills | Status: DC | PRN
Start: 2020-04-10 — End: 2020-06-10

## 2020-04-10 MED ORDER — GABAPENTIN 300 MG PO CAPS
600.0000 mg | ORAL_CAPSULE | Freq: Three times a day (TID) | ORAL | 0 refills | Status: DC
Start: 2020-04-10 — End: 2020-06-10

## 2020-04-10 MED ORDER — TOPIRAMATE 25 MG PO TABS
25.0000 mg | ORAL_TABLET | Freq: Every day | ORAL | 0 refills | Status: DC
Start: 2020-04-10 — End: 2020-06-10

## 2020-04-10 MED ORDER — METHOCARBAMOL 500 MG PO TABS: 500.0000 mg | ORAL_TABLET | Freq: Two times a day (BID) | ORAL | 0 refills | Status: DC | PRN

## 2020-04-10 MED ORDER — NAPHAZOLINE-GLYCERIN 0.012-0.2 % OP SOLN: 2.0000 [drp] | Freq: Every day | OPHTHALMIC | 0 refills | Status: DC | PRN

## 2020-04-10 NOTE — Assessment & Plan Note (Signed)
This discomfort is getting better.  I have adjusted some medications.  And I have asked him and his sister to begin decreasing the Neurontin in approximately 3 to 4 weeks.

## 2020-04-10 NOTE — Assessment & Plan Note (Signed)
This has resolved.  Will discontinue-potassium replacement.

## 2020-04-10 NOTE — Assessment & Plan Note (Signed)
I will taper topiramate.  Will discontinue methocarbamol.  Will discontinue acetaminophen except for as needed.

## 2020-04-10 NOTE — Assessment & Plan Note (Signed)
He needs some follow-up here.  He was expecting to see an ophthalmologist at [facility].  There is some issues scheduling that appointment.  I will ask our financial workers and social workers to help with him.
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
## 2020-04-21 ENCOUNTER — Other Ambulatory Visit: Payer: Self-pay

## 2020-04-21 ENCOUNTER — Ambulatory Visit
Admission: RE | Admit: 2020-04-21 | Discharge: 2020-04-21 | Disposition: A | Discharge status: Home / Self Care | Payer: Self-pay | Source: Ambulatory Visit | Attending: Neurosurgery | Admitting: Neurosurgery

## 2020-04-21 DIAGNOSIS — M542 Cervicalgia: Secondary | ICD-10-CM

## 2020-04-23 DIAGNOSIS — M542 Cervicalgia: Secondary | ICD-10-CM | POA: Insufficient documentation

## 2020-04-23 DIAGNOSIS — S22039A Unspecified fracture of third thoracic vertebra, initial encounter for closed fracture: Secondary | ICD-10-CM | POA: Insufficient documentation

## 2020-04-28 ENCOUNTER — Other Ambulatory Visit: Payer: Self-pay

## 2020-04-28 ENCOUNTER — Ambulatory Visit: Payer: No Typology Code available for payment source | Attending: Critical Care Medicine

## 2020-04-28 DIAGNOSIS — G5721 Lesion of femoral nerve, right lower limb: Secondary | ICD-10-CM | POA: Diagnosis present

## 2020-04-28 DIAGNOSIS — R2681 Unsteadiness on feet: Secondary | ICD-10-CM | POA: Diagnosis not present

## 2020-04-28 DIAGNOSIS — W3400XD Accidental discharge from unspecified firearms or gun, subsequent encounter: Secondary | ICD-10-CM | POA: Insufficient documentation

## 2020-04-28 DIAGNOSIS — W3400XA Accidental discharge from unspecified firearms or gun, initial encounter: Secondary | ICD-10-CM

## 2020-04-28 DIAGNOSIS — M6281 Muscle weakness (generalized): Secondary | ICD-10-CM | POA: Diagnosis not present
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
## 2020-04-29 ENCOUNTER — Ambulatory Visit: Payer: No Typology Code available for payment source

## 2020-04-29 DIAGNOSIS — G5721 Lesion of femoral nerve, right lower limb: Secondary | ICD-10-CM | POA: Diagnosis not present

## 2020-04-29 DIAGNOSIS — W3400XA Accidental discharge from unspecified firearms or gun, initial encounter: Secondary | ICD-10-CM

## 2020-04-29 DIAGNOSIS — R2681 Unsteadiness on feet: Secondary | ICD-10-CM

## 2020-04-29 DIAGNOSIS — M6281 Muscle weakness (generalized): Secondary | ICD-10-CM
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
## 2020-05-08 ENCOUNTER — Other Ambulatory Visit: Payer: Self-pay

## 2020-05-08 ENCOUNTER — Ambulatory Visit: Payer: No Typology Code available for payment source

## 2020-05-08 DIAGNOSIS — M6281 Muscle weakness (generalized): Secondary | ICD-10-CM

## 2020-05-08 DIAGNOSIS — R2681 Unsteadiness on feet: Secondary | ICD-10-CM

## 2020-05-08 DIAGNOSIS — G5721 Lesion of femoral nerve, right lower limb: Secondary | ICD-10-CM

## 2020-05-08 DIAGNOSIS — W3400XA Accidental discharge from unspecified firearms or gun, initial encounter: Secondary | ICD-10-CM
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
## 2020-05-12 ENCOUNTER — Ambulatory Visit: Payer: No Typology Code available for payment source

## 2020-05-12 ENCOUNTER — Other Ambulatory Visit: Payer: Self-pay

## 2020-05-12 DIAGNOSIS — G5721 Lesion of femoral nerve, right lower limb: Secondary | ICD-10-CM

## 2020-05-12 DIAGNOSIS — R2681 Unsteadiness on feet: Secondary | ICD-10-CM

## 2020-05-12 DIAGNOSIS — W3400XA Accidental discharge from unspecified firearms or gun, initial encounter: Secondary | ICD-10-CM

## 2020-05-12 DIAGNOSIS — M6281 Muscle weakness (generalized): Secondary | ICD-10-CM
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
## 2020-05-14 ENCOUNTER — Other Ambulatory Visit: Payer: Self-pay

## 2020-05-14 ENCOUNTER — Ambulatory Visit: Payer: No Typology Code available for payment source | Admitting: Physical Therapy

## 2020-05-14 ENCOUNTER — Encounter: Payer: Self-pay | Admitting: Physical Therapy

## 2020-05-14 DIAGNOSIS — R2681 Unsteadiness on feet: Secondary | ICD-10-CM

## 2020-05-14 DIAGNOSIS — G5721 Lesion of femoral nerve, right lower limb: Secondary | ICD-10-CM | POA: Diagnosis not present

## 2020-05-14 DIAGNOSIS — W3400XA Accidental discharge from unspecified firearms or gun, initial encounter: Secondary | ICD-10-CM

## 2020-05-14 DIAGNOSIS — M6281 Muscle weakness (generalized): Secondary | ICD-10-CM
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
## 2020-05-19 ENCOUNTER — Ambulatory Visit: Payer: No Typology Code available for payment source | Admitting: Physical Therapy

## 2020-05-19 ENCOUNTER — Encounter: Payer: Self-pay | Admitting: Physical Therapy

## 2020-05-19 ENCOUNTER — Telehealth: Payer: Self-pay

## 2020-05-19 ENCOUNTER — Other Ambulatory Visit: Payer: Self-pay | Admitting: Critical Care Medicine

## 2020-05-19 ENCOUNTER — Other Ambulatory Visit: Payer: Self-pay

## 2020-05-19 DIAGNOSIS — M6281 Muscle weakness (generalized): Secondary | ICD-10-CM

## 2020-05-19 DIAGNOSIS — G5721 Lesion of femoral nerve, right lower limb: Secondary | ICD-10-CM

## 2020-05-19 DIAGNOSIS — R2681 Unsteadiness on feet: Secondary | ICD-10-CM

## 2020-05-19 DIAGNOSIS — W3400XA Accidental discharge from unspecified firearms or gun, initial encounter: Secondary | ICD-10-CM

## 2020-05-19 NOTE — Telephone Encounter (Signed)
Copied from CRM [id number]. Topic: Quick Communication - Rx Refill/Question >> May 19, 2020  3:53 PM Jaquita Rector A wrote: Medication: gabapentin (NEURONTIN) 300 MG capsule, topiramate (TOPAMAX) 25 MG tablet, traMADol (ULTRAM) 50 MG tablet   Has the patient contacted their pharmacy? Yes.   (Agent: If no, request that the patient contact the pharmacy for the refill.) (Agent: If yes, when and what did the pharmacy advise?)  Preferred Pharmacy (with phone number or street name): [facility] - [place], [place] - [place] [address]  Phone:  [phone] Fax:  [phone]     Agent: Please be advised that RX refills may take up to 3 business days. We ask that you follow-up with your pharmacy.

## 2020-05-19 NOTE — Telephone Encounter (Signed)
Return call and spoke with Mike Sexton Mike Sexton sister. Medication list was reviewed, last prescrptions of Gabapentin and Topamax was prescribed by PCP. Tramadol has not been filled since 02/2020. She will call his PCP regarding the above, instructed to call office if they need any further assistance, she verbalizes understanding.
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
## 2020-05-19 NOTE — Telephone Encounter (Signed)
[name] has requested medication refills for Rx:  Gabapentin, Topiramate & Tramadol.   Please advise or refill.

## 2020-05-21 ENCOUNTER — Ambulatory Visit: Payer: No Typology Code available for payment source | Admitting: Physical Therapy

## 2020-05-21 ENCOUNTER — Other Ambulatory Visit: Payer: Self-pay

## 2020-05-21 ENCOUNTER — Encounter: Payer: Self-pay | Admitting: Physical Therapy

## 2020-05-21 DIAGNOSIS — G5721 Lesion of femoral nerve, right lower limb: Secondary | ICD-10-CM | POA: Diagnosis not present

## 2020-05-21 DIAGNOSIS — M6281 Muscle weakness (generalized): Secondary | ICD-10-CM

## 2020-05-21 DIAGNOSIS — R2681 Unsteadiness on feet: Secondary | ICD-10-CM

## 2020-05-21 DIAGNOSIS — W3400XA Accidental discharge from unspecified firearms or gun, initial encounter: Secondary | ICD-10-CM
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
## 2020-05-26 ENCOUNTER — Encounter: Payer: Self-pay | Admitting: Physical Therapy

## 2020-05-26 ENCOUNTER — Ambulatory Visit: Payer: No Typology Code available for payment source | Admitting: Physical Therapy

## 2020-05-26 ENCOUNTER — Other Ambulatory Visit: Payer: Self-pay

## 2020-05-26 DIAGNOSIS — W3400XA Accidental discharge from unspecified firearms or gun, initial encounter: Secondary | ICD-10-CM

## 2020-05-26 DIAGNOSIS — M6281 Muscle weakness (generalized): Secondary | ICD-10-CM

## 2020-05-26 DIAGNOSIS — G5721 Lesion of femoral nerve, right lower limb: Secondary | ICD-10-CM | POA: Diagnosis not present

## 2020-05-26 DIAGNOSIS — R2681 Unsteadiness on feet: Secondary | ICD-10-CM
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
## 2020-05-28 ENCOUNTER — Encounter: Payer: Self-pay | Admitting: Physical Medicine & Rehabilitation

## 2020-06-10 ENCOUNTER — Encounter: Payer: Self-pay | Admitting: Critical Care Medicine

## 2020-06-10 ENCOUNTER — Other Ambulatory Visit: Payer: Self-pay

## 2020-06-10 ENCOUNTER — Other Ambulatory Visit: Payer: Self-pay | Admitting: Critical Care Medicine

## 2020-06-10 ENCOUNTER — Ambulatory Visit: Payer: Self-pay | Attending: Critical Care Medicine | Admitting: Critical Care Medicine

## 2020-06-10 VITALS — BP 112/78 | HR 72 | Temp 98.4°F | Resp 16 | Wt 194.0 lb

## 2020-06-10 DIAGNOSIS — Z23 Encounter for immunization: Secondary | ICD-10-CM

## 2020-06-10 DIAGNOSIS — K219 Gastro-esophageal reflux disease without esophagitis: Secondary | ICD-10-CM

## 2020-06-10 DIAGNOSIS — S0285XD Fracture of orbit, unspecified, subsequent encounter for fracture with routine healing: Secondary | ICD-10-CM

## 2020-06-10 DIAGNOSIS — S0281XD Fracture of other specified skull and facial bones, right side, subsequent encounter for fracture with routine healing: Secondary | ICD-10-CM

## 2020-06-10 DIAGNOSIS — D62 Acute posthemorrhagic anemia: Secondary | ICD-10-CM

## 2020-06-10 DIAGNOSIS — Z1159 Encounter for screening for other viral diseases: Secondary | ICD-10-CM

## 2020-06-10 DIAGNOSIS — Z87828 Personal history of other (healed) physical injury and trauma: Secondary | ICD-10-CM

## 2020-06-10 DIAGNOSIS — E876 Hypokalemia: Secondary | ICD-10-CM

## 2020-06-10 DIAGNOSIS — G5711 Meralgia paresthetica, right lower limb: Secondary | ICD-10-CM

## 2020-06-10 MED ORDER — PANTOPRAZOLE SODIUM 40 MG PO TBEC: 40.0000 mg | DELAYED_RELEASE_TABLET | Freq: Every day | ORAL | 3 refills | Status: DC

## 2020-06-10 MED ORDER — SIMETHICONE 125 MG PO CAPS: 1.0000 | ORAL_CAPSULE | Freq: Two times a day (BID) | ORAL | 6 refills | Status: DC

## 2020-06-10 MED ORDER — GABAPENTIN 300 MG PO CAPS: 300.0000 mg | ORAL_CAPSULE | Freq: Three times a day (TID) | ORAL | 1 refills | Status: DC

## 2020-06-10 NOTE — Progress Notes (Signed)
Medication concerns: medication RF- Has bloating after he eats
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
## 2020-06-10 NOTE — Assessment & Plan Note (Signed)
Repeat metabolic profile 
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
## 2020-06-10 NOTE — Assessment & Plan Note (Signed)
Acute blood loss anemia  Follow-up CBC

## 2020-06-10 NOTE — Assessment & Plan Note (Signed)
Reflux disease with significant upper airway involvement  Begin Protonix 40 mg daily
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
## 2020-06-11 LAB — BASIC METABOLIC PANEL
BUN/Creatinine Ratio: 22 — ABNORMAL HIGH (ref 9–20)
BUN: 15 mg/dL (ref 6–20)
CO2: 22 mmol/L (ref 20–29)
Calcium: 10 mg/dL (ref 8.7–10.2)
Chloride: 104 mmol/L (ref 96–106)
Creatinine, Ser: 0.68 mg/dL — ABNORMAL LOW (ref 0.76–1.27)
GFR calc Af Amer: 148 mL/min/{1.73_m2} (ref >59)
GFR calc non Af Amer: 128 mL/min/{1.73_m2} (ref >59)
Glucose: 109 mg/dL — ABNORMAL HIGH (ref 65–99)
Potassium: 4.6 mmol/L (ref 3.5–5.2)
Sodium: 143 mmol/L (ref 134–144)

## 2020-06-11 LAB — CBC WITH DIFFERENTIAL/PLATELET
Basophils Absolute: 0.1 10*3/uL (ref 0.0–0.2)
Basos: 1 %
EOS (ABSOLUTE): 0.1 10*3/uL (ref 0.0–0.4)
Eos: 1 %
Hematocrit: 44.1 % (ref 37.5–51.0)
Hemoglobin: 14.9 g/dL (ref 13.0–17.7)
Immature Grans (Abs): 0 10*3/uL (ref 0.0–0.1)
Immature Granulocytes: 0 %
Lymphocytes Absolute: 3.3 10*3/uL — ABNORMAL HIGH (ref 0.7–3.1)
Lymphs: 40 %
MCH: 28.2 pg (ref 26.6–33.0)
MCHC: 33.8 g/dL (ref 31.5–35.7)
MCV: 83 fL (ref 79–97)
Monocytes Absolute: 0.6 10*3/uL (ref 0.1–0.9)
Monocytes: 7 %
Neutrophils Absolute: 4.2 10*3/uL (ref 1.4–7.0)
Neutrophils: 51 %
Platelets: 331 10*3/uL (ref 150–450)
RBC: 5.29 x10E6/uL (ref 4.14–5.80)
RDW: 14 % (ref 11.6–15.4)
WBC: 8.2 10*3/uL (ref 3.4–10.8)

## 2020-06-11 LAB — HCV AB W REFLEX TO QUANT PCR: HCV Ab: <0.1 s/co ratio (ref 0.0–0.9)

## 2020-06-11 LAB — HCV INTERPRETATION

## 2020-06-26 ENCOUNTER — Ambulatory Visit: Payer: Self-pay

## 2020-06-28 ENCOUNTER — Emergency Department (HOSPITAL_COMMUNITY): Payer: 59

## 2020-06-28 ENCOUNTER — Other Ambulatory Visit: Payer: Self-pay

## 2020-06-28 ENCOUNTER — Emergency Department (HOSPITAL_COMMUNITY)
Admission: EM | Admit: 2020-06-28 | Discharge: 2020-06-29 | Disposition: A | Discharge status: Home / Self Care | Payer: 59 | Attending: Emergency Medicine | Admitting: Emergency Medicine

## 2020-06-28 ENCOUNTER — Encounter (HOSPITAL_COMMUNITY): Payer: Self-pay | Admitting: Emergency Medicine

## 2020-06-28 DIAGNOSIS — R7401 Elevation of levels of liver transaminase levels: Secondary | ICD-10-CM | POA: Insufficient documentation

## 2020-06-28 DIAGNOSIS — K219 Gastro-esophageal reflux disease without esophagitis: Secondary | ICD-10-CM | POA: Insufficient documentation

## 2020-06-28 DIAGNOSIS — R1013 Epigastric pain: Secondary | ICD-10-CM | POA: Diagnosis present

## 2020-06-28 DIAGNOSIS — R945 Abnormal results of liver function studies: Secondary | ICD-10-CM

## 2020-06-28 DIAGNOSIS — K8020 Calculus of gallbladder without cholecystitis without obstruction: Secondary | ICD-10-CM

## 2020-06-28 LAB — COMPREHENSIVE METABOLIC PANEL
ALT: 176 U/L — ABNORMAL HIGH (ref 0–44)
AST: 189 U/L — ABNORMAL HIGH (ref 15–41)
Albumin: 4.2 g/dL (ref 3.5–5.0)
Alkaline Phosphatase: 80 U/L (ref 38–126)
Anion gap: 10 (ref 5–15)
BUN: 16 mg/dL (ref 6–20)
CO2: 25 mmol/L (ref 22–32)
Calcium: 9.3 mg/dL (ref 8.9–10.3)
Chloride: 102 mmol/L (ref 98–111)
Creatinine, Ser: 1.11 mg/dL (ref 0.61–1.24)
GFR, Estimated: >60 mL/min (ref >60)
Glucose, Bld: 135 mg/dL — ABNORMAL HIGH (ref 70–99)
Potassium: 3.9 mmol/L (ref 3.5–5.1)
Sodium: 137 mmol/L (ref 135–145)
Total Bilirubin: 0.6 mg/dL (ref 0.3–1.2)
Total Protein: 7 g/dL (ref 6.5–8.1)

## 2020-06-28 LAB — CBC
HCT: 42.4 % (ref 39.0–52.0)
Hemoglobin: 14.4 g/dL (ref 13.0–17.0)
MCH: 28.5 pg (ref 26.0–34.0)
MCHC: 34 g/dL (ref 30.0–36.0)
MCV: 83.8 fL (ref 80.0–100.0)
Platelets: 305 10*3/uL (ref 150–400)
RBC: 5.06 MIL/uL (ref 4.22–5.81)
RDW: 13.8 % (ref 11.5–15.5)
WBC: 6.3 10*3/uL (ref 4.0–10.5)
nRBC: 0 % (ref 0.0–0.2)

## 2020-06-28 LAB — URINALYSIS, ROUTINE W REFLEX MICROSCOPIC
Bilirubin Urine: NEGATIVE
Glucose, UA: NEGATIVE mg/dL
Hgb urine dipstick: NEGATIVE
Ketones, ur: NEGATIVE mg/dL
Leukocytes,Ua: NEGATIVE
Nitrite: NEGATIVE
Protein, ur: NEGATIVE mg/dL
Specific Gravity, Urine: 1.019 (ref 1.005–1.030)
pH: 6 (ref 5.0–8.0)

## 2020-06-28 LAB — LIPASE, BLOOD: Lipase: 45 U/L (ref 11–51)

## 2020-06-28 LAB — TROPONIN I (HIGH SENSITIVITY)
Troponin I (High Sensitivity): 3 ng/L (ref <18)
Troponin I (High Sensitivity): 3 ng/L (ref <18)

## 2020-06-28 MED ORDER — CIPROFLOXACIN IN D5W 400 MG/200ML IV SOLN
400.0000 mg | Freq: Once | INTRAVENOUS | Status: AC
  Administered 2020-06-28: 400 mg via INTRAVENOUS
  Filled 2020-06-28: qty 200

## 2020-06-28 MED ORDER — ONDANSETRON HCL 4 MG PO TABS: 4.0000 mg | ORAL_TABLET | Freq: Three times a day (TID) | ORAL | 0 refills | Status: DC | PRN

## 2020-06-28 MED ORDER — ASPIRIN 81 MG PO CHEW
CHEWABLE_TABLET | ORAL | Status: AC
  Administered 2020-06-28: 324 mg
  Filled 2020-06-28: qty 4

## 2020-06-28 MED ORDER — ACETAMINOPHEN 500 MG PO TABS: 1000.0000 mg | ORAL_TABLET | Freq: Once | ORAL | Status: DC

## 2020-06-28 NOTE — ED Notes (Signed)
Pt sleeping. 
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
## 2020-06-28 NOTE — Discharge Instructions (Signed)
Toma protonix cada dia por dolor.  No toma alcohol.  Da [place] Dear cita con el doctor debajo para quitar la cesicula biliar.  Regresa a la sala de emergencia si tiene fiebre, dolor muy fuerte, vomita, o nuevas sintomas.

## 2020-06-28 NOTE — ED Notes (Signed)
Pt to mri 

## 2020-06-28 NOTE — ED Notes (Signed)
Pt still in ultrasound.
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
## 2020-06-28 NOTE — ED Notes (Signed)
Pt transported to US

## 2020-06-28 NOTE — ED Provider Notes (Signed)
MSE was initiated and I personally evaluated the patient and placed orders (if any) at  3:28 AM on June 28, 2020.  The patient appears stable so that the remainder of the MSE may be completed by another provider.  Patient here with complaints of abdominal bloating and burning discomfort.  No chest pain or shortness of breath.  EKGs obtained in triage show minimal anterior ST elevation with inferior T wave inversions with no old for comparison.  He has no risk factors for ACS.  Do not think this is a STEMI.  Labs are pending.  He is hemodynamically stable and in no distress.  I feel he is safe to be placed back in the waiting room while labs are pending and while awaiting open bed.   Shail Urbas, Layla Maw, DO 06/28/20 [phone]

## 2020-06-28 NOTE — ED Triage Notes (Addendum)
Pt reports stomach and CP pain for the last 2 hours. Pt reports pain is upper abdomen described as sharp, burning. Denies nausea, vomiting, fevers and diarrhea. Pt awake, alert, appropriate. VSS.

## 2020-07-01 ENCOUNTER — Other Ambulatory Visit: Payer: Self-pay

## 2020-07-01 ENCOUNTER — Encounter: Payer: Self-pay | Admitting: Critical Care Medicine

## 2020-07-01 ENCOUNTER — Ambulatory Visit: Payer: 59 | Attending: Critical Care Medicine | Admitting: Critical Care Medicine

## 2020-07-01 DIAGNOSIS — K8010 Calculus of gallbladder with chronic cholecystitis without obstruction: Secondary | ICD-10-CM

## 2020-07-01 NOTE — Patient Instructions (Signed)
Colelitiasis Cholelithiasis  La colelitiasis es una enfermedad de la vescula biliar en la que se forman clculos biliares. La vescula biliar es un rgano que almacena bilis. La bilis se forma en el hgado y [place] a Engineer, agricultural. Los clculos comienzan como pequeos cristales y lentamente se transforman en piedras. Es posible que no presenten sntomas hasta que la vescula se endurece (contrae) y un clculo biliar bloquea un conducto (ataque de la vescula biliar) y esto puede Programmer, multimedia. La colelitiasis tambin es conocida como clculos en la vescula biliar. Hay dos tipos principales de clculos biliares:  Clculos de colesterol. Estos se forman por el colesterol endurecido y generalmente son de color amarillo verdoso. Son el tipo de clculos biliares ms frecuente. El colesterol es una sustancia blanca y cerosa parecida a la grasa que se forma en el hgado.  Clculos de pigmento. Estos son de color oscuro y estn formados por una sustancia amarillo rojiza que se forma cuando la hemoglobina de los glbulos rojos se descompone (bilirrubina). Cules son las causas? Este trastorno puede ser causado por un desequilibrio en las sustancias que componen la bilis. Esto puede suceder si la bilis:  Tiene mucha bilirrubina.  Tiene mucho colesterol.  No tiene suficiente sales biliares. Estas sales le ayudan al organismo a Environmental health practitioner y a Mining engineer. En ciertos casos, esta afeccin tambin puede ser causada por una vescula biliar que no se vaca completamente o lo suficientemente seguido. Qu incrementa el riesgo? Los siguientes factores pueden hacer que usted sea ms propenso a Aeronautical engineer afeccin:  Ser mujer.  Tener embarazos mltiples. Algunas veces los mdicos aconsejan extirpar los clculos biliares antes de futuros embarazos.  Tener una dieta con demasiadas comidas fritas, grasas y carbohidratos refinados, como el pan blanco y el arroz blanco.  Ser obeso.  Tener ms de 40  aos.  Uso prolongado de medicamentos que contienen hormonas femeninas (estrgenos).  Tener diabetes mellitus.  Prdida rpida de peso.  Antecedentes familiares de clculos biliares.  Ser descendiente de mexicanos o indios norteamericanos.  Tener una enfermedad intestinal como la enfermedad de Crohn.  Tener sndrome metablico.  Tener cirrosis.  Tener tipos graves de anemia, como la anemia drepanoctica. Cules son los signos o los sntomas? En la [employer] no hay sntomas. Estos se denominan clculos silenciosos. Si un clculo biliar bloquea las vas biliares, puede causar un ataque de la vescula biliar. El principal sntoma de un ataque de la vescula biliar es un dolor repentino en la parte superior derecha del abdomen. El dolor aparece generalmente a la noche o despus de comer comidas abundantes. El dolor puede durar una o varias horas y se puede extender hasta el hombro derecho o el pecho. Si el conducto biliar est bloqueado durante ms de algunas horas, puede causar infeccin o inflamacin de la vescula biliar, del hgado o del pncreas, lo que puede provocar:  Nuseas.  Vmitos.  Dolor abdominal durante 5 horas o ms.  Fiebre o escalofros.  Coloracin amarillenta de la piel y la partes blancas de los ojos (ictericia).  Orina de color oscuro.  Heces de color claro. Cmo se diagnostica? Esta afeccin se puede diagnosticar en funcin de lo siguiente:  Un examen fsico.  Sus antecedentes mdicos.  Una ecografa de la vescula biliar.  Exploracin por tomografa computarizada (TC).  Resonancia magntica (RM).  Un anlisis de sangre para detectar signos de infeccin o inflamacin.  Un estudio de la vescula biliar y las vas biliares (sistema biliar) que Ladonna Snide  material radiactivo no daino y Therapist, occupational que pueden Naval architect radiactivo (colescintigrafa). Este estudio examina cmo se contrae la vescula biliar y si las vas biliares  estn bloqueadas.  Insertar un pequeo tubo con una cmara en el extremo (endoscopio) a travs de la boca para inspeccionar las vas biliares y Engineer, manufacturing bloqueos (colangiopancreatografa retrgrada endoscpica). Cmo se trata? El tratamiento de los clculos biliares depende de la gravedad de la afeccin. Los clculos silenciosos no requieren TEFL teacher. Si los clculos causan un ataque de la vescula biliar u otros sntomas, puede ser Aeronautical engineer. Las opciones de tratamiento son:  Kandis Ban para extirpar la vescula biliar (colecistectoma). Este es el tratamiento ms frecuente.  Medicamentos para [employer]. Estos son ms efectivos para tratar clculos pequeos. Es posible que tenga que tomar medicamentos durante 6 a 12 meses.  Tratamiento con ondas de choque (litotricia biliar extracorporal). En este tratamiento, una mquina de ultrasonido enva ondas de choque a la vescula biliar para destruir los clculos en pequeos fragmentos. Estos fragmentos luego podrn pasar a los intestinos o ser disueltos con medicamentos. Esto no se hace con frecuencia.  Extraccin de los clculos mediante una colangiopancreatografa retrgrada endoscpica. Se utiliza una pequea cesta anexa al endoscopio para capturar y extraer los clculos. Siga estas indicaciones en su casa:  Tome los medicamentos de venta libre y los recetados solamente como se lo haya indicado el mdico.  Mantenga un peso saludable y consuma una dieta saludable. Que puede comprender lo siguiente: ? Reducir los [employer], como las comidas fritas. ? Reducir los carbohidratos refinados, como el pan blanco y el arroz blanco. ? Aumentar la cantidad de [place]. Alimentarse con alimentos como almendras, frutas y frijoles.  Concurra a todas las visitas de control como se lo haya indicado el mdico. Esto es importante. Comunquese con un mdico si:  Piensa que ha tenido un ataque de vescula biliar.  Le han  diagnosticado clculos silenciosos y tiene dolor abdominal o indigestin. Solicite ayuda de inmediato si:  Tiene dolor por un ataque de vescula biliar que dura ms de 2horas.  Tiene dolor abdominal que dura ms de 5horas.  Tiene fiebre o siente escalofros.  Tiene nuseas o vmitos persistentes.  Tiene ictericia.  Orina de color oscuro o tiene heces de color plido. Resumen  La colelitiasis (tambin llamada clculos en la vescula biliar) es una enfermedad en la que se forman clculos en la vescula.  A este trastorno lo causa un desequilibrio en las sustancias que componen la bilis. Esto puede suceder si la bilis tiene demasiado colesterol, demasiada bilirrubina o cantidad insuficiente de sales biliares.  Es ms probable que [employer] afeccin si es [place], est [place], [place] medicamentos con [place], es obeso, es mayor de 40 aos o tiene antecedentes familiares de clculos biliares. Tambin puede desarrollar clculos biliares si tiene diabetes, una enfermedad intestinal, cirrosis o sndrome metablico.  El tratamiento de los clculos biliares depende de la gravedad de la afeccin. Los clculos silenciosos no requieren TEFL teacher.  Si los clculos causan un ataque de la vescula biliar u otros sntomas, puede ser necesario un tratamiento. El tratamiento ms frecuente es la [place] para extirpar la vescula biliar. Esta informacin no tiene Theme park manager el consejo del mdico. Asegrese de hacerle al mdico cualquier pregunta que tenga. Document Revised: 03/11/2017 Document Reviewed: 12/06/2012 Elsevier Patient Education  2020 [employer].    Plan de alimentacin para problemas de vescula biliar Gallbladder Eating Plan Si tiene una afeccin de la vescula  biliar, puede tener problemas para digerir las grasas. Consumir una dieta con bajo contenido de grasas puede [employer] sntomas, y puede ser beneficiosa antes y despus de [place] de extraccin de vescula  biliar (colecistectoma). El mdico puede recomendarle que trabaje con un especialista en dietas y alimentacin (nutricionista) para que lo ayude a reducir la cantidad de grasas en su dieta. Consejos para seguir este plan [place] generales  Limite el consumo de grasas a menos del 30% del total de caloras diarias. Si usted ingiere alrededor de 1800 caloras diarias, esto es menos de 60 gramos (g) de Automotive engineer.  La grasa es una parte importante de una dieta saludable. Consumir una dieta con bajo contenido de grasas puede dificultar mantener un peso corporal saludable. Pregunte a su nutricionista qu cantidad de grasas, caloras y otros nutrientes necesita diariamente.  Haga comidas pequeas y frecuentes Freight forwarder de tres comidas abundantes.  Beba de 8 a 10 vasos de lquido por [employer]. Beba suficiente lquido como para mantener la orina clara o de color amarillo plido.  Limite el consumo de alcohol a no ms de por da si es mujer y no est [place], y por da si es hombre. Una medida equivale a 12oz ( ) de cerveza, 5oz ( ) de vino o 1oz (38ml) de bebidas alcohlicas de alta graduacin. Lea las etiquetas de los alimentos  Consulte la informacin nutricional en las etiquetas de los alimentos para conocer la cantidad de grasas por porcin. Elija alimentos con menos de 3 gramos de grasas por porcin. De compras  Elija alimentos saludables sin grasas o con bajo contenido de [place]. Busque las palabras "sin grasa", "bajo en grasas" o "con bajo contenido de grasas".  Evite comprar alimentos procesados o envasados. Coccin  Para cocinar opte por mtodos con bajo contenido de grasa, como hornear, hervir, Software engineer y Transport planner.  Cocine con pequeas cantidades de grasas saludables, como aceite de [place], aceite de semilla de [place], aceite de canola o [place]. Qu alimentos se recomiendan?   Todas las frutas y verduras frescas, congeladas o  enlatadas.  Cereales integrales.  Leche y yogur semidescremados y descremados.  Vita [place], aves sin piel, pescado, huevos y legumbres.  Suplementos proteicos con bajo contenido de grasas, en polvo o lquidos.  Hierbas y especias. Qu alimentos no se recomiendan?  Alimentos muy grasos. Entre estos se incluyen productos panificados, comida rpida, cortes de carne con grasa, helados, pan francs, rosquillas dulces, pizza, pan de queso, alimentos cubiertos con [place], salsas con crema o queso.  Comidas fritas. Se incluyen papas fritas, tempura, pescado rebozado, milanesas de pollo, panes fritos y dulces.  Alimentos con [employer].  Alimentos que causan gases o meteorismo. Resumen  Una dieta de bajo contenido graso puede ser beneficiosa si tiene una afeccin de la vescula biliar o puede hacerla antes y despus de someterse a una ciruga de vescula.  Limite el consumo de grasas a menos del 30% del total de caloras diarias. Esto es casi 60 gramos de grasa si usted ingiere 1800 caloras diarias.  Haga comidas pequeas y frecuentes Freight forwarder de tres comidas abundantes. Esta informacin no tiene Theme park manager el consejo del mdico. Asegrese de hacerle al mdico cualquier pregunta que tenga. Document Revised: 01/18/2017 Document Reviewed: 01/18/2017 Elsevier Patient Education  2020 [employer].

## 2020-07-01 NOTE — Assessment & Plan Note (Signed)
  MRCP and ultrasound consistent with cholelithiasis there may be an element of cholecystitis with this although lab work was nonrevealing for this during the emergency room admission  Recommendation was referral to general surgery for evaluation the patient does have insurance  I therefore made a referral to general surgery for further evaluation of the patient's care
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
## 2020-07-02 ENCOUNTER — Other Ambulatory Visit: Payer: Self-pay

## 2020-07-02 ENCOUNTER — Encounter: Payer: Self-pay | Admitting: Physical Medicine & Rehabilitation

## 2020-07-02 ENCOUNTER — Encounter: Payer: 59 | Attending: Physical Medicine & Rehabilitation | Admitting: Physical Medicine & Rehabilitation

## 2020-07-02 DIAGNOSIS — S069X0A Unspecified intracranial injury without loss of consciousness, initial encounter: Secondary | ICD-10-CM | POA: Insufficient documentation

## 2020-07-02 DIAGNOSIS — S069X0S Unspecified intracranial injury without loss of consciousness, sequela: Secondary | ICD-10-CM | POA: Insufficient documentation

## 2020-07-02 DIAGNOSIS — S24109S Unspecified injury at unspecified level of thoracic spinal cord, sequela: Secondary | ICD-10-CM | POA: Diagnosis present

## 2020-07-02 MED ORDER — GABAPENTIN 300 MG PO CAPS: 300.0000 mg | ORAL_CAPSULE | Freq: Three times a day (TID) | ORAL | 2 refills | Status: DC
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
## 2020-07-02 NOTE — Patient Instructions (Addendum)
PLEASE FEEL FREE TO CALL OUR OFFICE WITH ANY PROBLEMS OR QUESTIONS ([phone])      

## 2020-07-23 ENCOUNTER — Ambulatory Visit: Payer: 59 | Attending: Critical Care Medicine

## 2020-07-23 ENCOUNTER — Other Ambulatory Visit: Payer: Self-pay

## 2020-08-11 ENCOUNTER — Ambulatory Visit: Payer: Self-pay | Admitting: Critical Care Medicine

## 2020-08-19 ENCOUNTER — Other Ambulatory Visit: Payer: Self-pay

## 2020-08-19 ENCOUNTER — Encounter: Payer: Self-pay | Admitting: Critical Care Medicine

## 2020-08-19 ENCOUNTER — Other Ambulatory Visit: Payer: Self-pay | Admitting: Pharmacy Technician

## 2020-08-19 ENCOUNTER — Ambulatory Visit: Payer: 59 | Attending: Critical Care Medicine | Admitting: Critical Care Medicine

## 2020-08-19 VITALS — BP 117/75 | HR 66 | Temp 98.0°F | Resp 16 | Wt 202.4 lb

## 2020-08-19 DIAGNOSIS — K8010 Calculus of gallbladder with chronic cholecystitis without obstruction: Secondary | ICD-10-CM | POA: Diagnosis not present

## 2020-08-19 DIAGNOSIS — S24109S Unspecified injury at unspecified level of thoracic spinal cord, sequela: Secondary | ICD-10-CM | POA: Diagnosis not present

## 2020-08-19 DIAGNOSIS — I609 Nontraumatic subarachnoid hemorrhage, unspecified: Secondary | ICD-10-CM | POA: Insufficient documentation

## 2020-08-19 DIAGNOSIS — S0285XD Fracture of orbit, unspecified, subsequent encounter for fracture with routine healing: Secondary | ICD-10-CM

## 2020-08-19 DIAGNOSIS — G5711 Meralgia paresthetica, right lower limb: Secondary | ICD-10-CM

## 2020-08-19 DIAGNOSIS — Z87828 Personal history of other (healed) physical injury and trauma: Secondary | ICD-10-CM | POA: Diagnosis not present

## 2020-08-19 MED ORDER — GABAPENTIN 300 MG PO CAPS: 300.0000 mg | ORAL_CAPSULE | Freq: Three times a day (TID) | ORAL | 2 refills | Status: DC

## 2020-08-19 MED ORDER — METHOCARBAMOL 500 MG PO TABS: 500.0000 mg | ORAL_TABLET | Freq: Four times a day (QID) | ORAL | 2 refills | Status: DC | PRN
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
## 2020-08-19 NOTE — Assessment & Plan Note (Signed)
The patient endorses complaints of numbness and cramps in both legs with difficulties with movement upon waking up in the morning. Continue taking gabapentin (NEURONTIN) 300 MG capsule; Take 1 capsule (300 mg total) by mouth 3 (three) times daily.
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
## 2020-08-19 NOTE — Assessment & Plan Note (Addendum)
The patient voices resolution of abdominal pain, denying symptoms of nausea, vomiting, and RUQ pains. 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 Due to debt in collections, the patient could not attend the referral.
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
## 2020-08-19 NOTE — Assessment & Plan Note (Signed)
The patient endorses complaints of numbness and cramps in both legs with difficulties with movement upon waking up in the morning, likely due to nerve damage from the injury sustained.  Continue taking gabapentin (NEURONTIN) 300 MG capsule; Take 1 capsule (300 mg total) by mouth 3 (three) times daily. Continue taking methocarbamol (ROBAXIN) 500 MG tablet; Take 1 tablet (500 mg total) by mouth every 6 (six) hours as needed for muscle spasms.

## 2020-08-27 ENCOUNTER — Ambulatory Visit: Payer: Self-pay | Admitting: Surgery
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
## 2020-09-02 ENCOUNTER — Encounter: Payer: Self-pay | Admitting: General Surgery

## 2020-09-02 ENCOUNTER — Ambulatory Visit: Payer: 59 | Attending: General Surgery | Admitting: General Surgery

## 2020-09-02 ENCOUNTER — Other Ambulatory Visit: Payer: Self-pay

## 2020-09-02 VITALS — BP 122/73 | HR 71 | Temp 97.8°F | Resp 16 | Ht 65.0 in | Wt 199.0 lb

## 2020-09-02 DIAGNOSIS — Z87828 Personal history of other (healed) physical injury and trauma: Secondary | ICD-10-CM | POA: Diagnosis not present

## 2020-09-02 DIAGNOSIS — M795 Residual foreign body in soft tissue: Secondary | ICD-10-CM | POA: Diagnosis not present
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
## 2020-09-02 NOTE — Patient Instructions (Signed)
Keep current dressing in place for two days.  May then remove and shower, pat dry and recover with sterile gauze.  Follow up in two weeks to remove sutures.  Marland Kitchen
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
## 2020-09-16 NOTE — Progress Notes (Addendum)
COVID Vaccine Completed: x2 Date COVID Vaccine completed:  06-26-20, 08-01-20 Has received booster: COVID vaccine manufacturer: [employer]   Date of COVID positive in last 90 days: N/A  PCP - Shan Levans, MD Cardiologist -   Chest x-ray - 06-28-20 Epic EKG - 06-30-20 Epic Stress Test -  ECHO -  Cardiac Cath -  Pacemaker/ICD device last checked: Spinal Cord Stimulator:  Sleep Study - N/A CPAP -   Fasting Blood Sugar - N/A Checks Blood Sugar _____ times a day  Blood Thinner Instructions:  N/A Aspirin Instructions: Last Dose:  Activity level:  Can go up a flight of stairs and perform activities of daily living without stopping and without symptoms of chest pain or shortness of breath.  Able to exercise without symptoms  Anesthesia review: N/A  Patient denies shortness of breath, fever, cough and chest pain at PAT appointment   Patient verbalized understanding of instructions that were given to them at the PAT appointment. Patient was also instructed that they will need to review over the PAT instructions again at home before surgery.

## 2020-09-16 NOTE — Patient Instructions (Addendum)
DUE TO COVID-19 ONLY ONE VISITOR IS ALLOWED TO COME WITH YOU AND STAY IN THE WAITING ROOM ONLY DURING PRE OP AND PROCEDURE.     COVID SWAB TESTING MUST BE COMPLETED ON:  Wednesday, 10-01-20 @ 1:00 PM   [address] [place], [place] [address]  (Must self quarantine after testing. Follow instructions on handout.)        Your procedure is scheduled on:   Friday, 10-03-20   Report to [facility] Main  Entrance    Report to admitting at 7:30 AM   Call this number if you have problems the morning of surgery [phone]   Do not eat food :After Midnight.   May have liquids until 6:30 AM day of surgery  CLEAR LIQUID DIET  Foods Allowed                                                                     Foods Excluded  Water, Black Coffee and tea, regular and decaf               liquids that you cannot  Plain Jell-O in any flavor  (No red)                                      see through such as: Fruit ices (not with fruit pulp)                                      milk, soups, orange juice              Iced Popsicles (No red)                                      All solid food                                   Apple juices Sports drinks like Gatorade (No red) Lightly seasoned clear broth or consume(fat free) Sugar, honey syrup    COral Hygiene is also important to reduce your risk of infection.                                    Remember - BRUSH YOUR TEETH THE MORNING OF SURGERY WITH YOUR REGULAR TOOTHPASTE   Do NOT smoke after Midnight   Take these medicines the morning of surgery with A SIP OF WATER: Gabapentin                               You may not have any metal on your body including  jewelry, and body piercings             Do not wear  lotions, powders, perfumes/cologne, or deodorant  Men may shave face and neck.   Do not bring valuables to the hospital. [facility] IS NOT             RESPONSIBLE   FOR VALUABLES.   Contacts, dentures or bridgework  may not be worn into surgery.   Patients discharged the day of surgery will not be allowed to drive home.              Please read over the following fact sheets you were given: IF YOU HAVE QUESTIONS ABOUT YOUR PRE OP INSTRUCTIONS PLEASE CALL  [phone] [facility] - Preparing for Surgery Before surgery, you can play an important role.  Because skin is not sterile, your skin needs to be as free of germs as possible.  You can reduce the number of germs on your skin by washing with CHG (chlorahexidine gluconate) soap before surgery.  CHG is an antiseptic cleaner which kills germs and bonds with the skin to continue killing germs even after washing. Please DO NOT use if you have an allergy to CHG or antibacterial soaps.  If your skin becomes reddened/irritated stop using the CHG and inform your nurse when you arrive at Short Stay. Do not shave (including legs and underarms) for at least 48 hours prior to the first CHG shower.  You may shave your face/neck.  Please follow these instructions carefully:  1.  Shower with CHG Soap the night before surgery and the  morning of surgery.  2.  If you choose to wash your hair, wash your hair first as usual with your normal  shampoo.  3.  After you shampoo, rinse your hair and body thoroughly to remove the shampoo.                             4.  Use CHG as you would any other liquid soap.  You can apply chg directly to the skin and wash.  Gently with a scrungie or clean washcloth.  5.  Apply the CHG Soap to your body ONLY FROM THE NECK DOWN.   Do   not use on face/ open                           Wound or open sores. Avoid contact with eyes, ears mouth and   genitals (private parts).                       Wash face,  Genitals (private parts) with your normal soap.             6.  Wash thoroughly, paying special attention to the area where your    surgery  will be performed.  7.  Thoroughly rinse your body with warm water from the neck down.  8.  DO  NOT shower/wash with your normal soap after using and rinsing off the CHG Soap.                9.  Pat yourself dry with a clean towel.            10.  Wear clean pajamas.            11.  Place clean sheets on your bed the night of your first shower and do not  sleep with pets. Day of Surgery : Do not apply any lotions/deodorants the morning of surgery.  Please wear clean clothes to the hospital/surgery center.  FAILURE TO FOLLOW THESE INSTRUCTIONS MAY RESULT IN THE CANCELLATION OF YOUR SURGERY  PATIENT SIGNATURE_________________________________  NURSE SIGNATURE__________________________________  ________________________________________________________________________

## 2020-09-18 ENCOUNTER — Encounter: Payer: Self-pay | Admitting: General Surgery

## 2020-09-18 ENCOUNTER — Other Ambulatory Visit: Payer: Self-pay

## 2020-09-18 ENCOUNTER — Ambulatory Visit: Payer: No Typology Code available for payment source | Attending: General Surgery | Admitting: General Surgery

## 2020-09-18 VITALS — BP 119/72 | HR 71 | Ht 65.0 in | Wt 196.2 lb

## 2020-09-18 DIAGNOSIS — Z09 Encounter for follow-up examination after completed treatment for conditions other than malignant neoplasm: Secondary | ICD-10-CM

## 2020-09-18 NOTE — Progress Notes (Signed)
Patient here for suture removal  Two nylon sutures removed from left knee area, and two from the upper back.  Both sites have healed well without evidence of infection.    JW

## 2020-09-18 NOTE — Patient Instructions (Signed)
Shower, pat dry.  Do not soak for a week.

## 2020-09-22 ENCOUNTER — Other Ambulatory Visit: Payer: Self-pay

## 2020-09-22 ENCOUNTER — Encounter (HOSPITAL_COMMUNITY)
Admission: RE | Admit: 2020-09-22 | Discharge: 2020-09-22 | Disposition: A | Discharge status: Home / Self Care | Payer: 59 | Source: Ambulatory Visit | Attending: Surgery | Admitting: Surgery

## 2020-09-22 ENCOUNTER — Encounter (HOSPITAL_COMMUNITY): Payer: Self-pay

## 2020-09-22 DIAGNOSIS — Z01812 Encounter for preprocedural laboratory examination: Secondary | ICD-10-CM | POA: Diagnosis not present

## 2020-09-22 HISTORY — DX: Accidental discharge from unspecified firearms or gun, initial encounter: W34.00XA

## 2020-09-22 LAB — CBC
HCT: 45.1 % (ref 39.0–52.0)
Hemoglobin: 14.6 g/dL (ref 13.0–17.0)
MCH: 28.7 pg (ref 26.0–34.0)
MCHC: 32.4 g/dL (ref 30.0–36.0)
MCV: 88.8 fL (ref 80.0–100.0)
Platelets: 308 10*3/uL (ref 150–400)
RBC: 5.08 MIL/uL (ref 4.22–5.81)
RDW: 13.2 % (ref 11.5–15.5)
WBC: 6.8 10*3/uL (ref 4.0–10.5)
nRBC: 0 % (ref 0.0–0.2)

## 2020-09-30 ENCOUNTER — Other Ambulatory Visit (HOSPITAL_COMMUNITY): Payer: No Typology Code available for payment source

## 2020-10-01 ENCOUNTER — Other Ambulatory Visit (HOSPITAL_COMMUNITY)
Admission: RE | Admit: 2020-10-01 | Discharge: 2020-10-01 | Disposition: A | Discharge status: Home / Self Care | Payer: 59 | Source: Ambulatory Visit | Attending: Surgery | Admitting: Surgery

## 2020-10-01 DIAGNOSIS — Z01812 Encounter for preprocedural laboratory examination: Secondary | ICD-10-CM | POA: Insufficient documentation

## 2020-10-01 DIAGNOSIS — Z20822 Contact with and (suspected) exposure to covid-19: Secondary | ICD-10-CM | POA: Diagnosis not present

## 2020-10-01 LAB — SARS CORONAVIRUS 2 (TAT 6-24 HRS): SARS Coronavirus 2: NEGATIVE

## 2020-10-02 MED ORDER — BUPIVACAINE LIPOSOME 1.3 % IJ SUSP
20.0000 mL | Freq: Once | INTRAMUSCULAR | Status: DC
  Filled 2020-10-02: qty 20

## 2020-10-03 ENCOUNTER — Encounter (HOSPITAL_COMMUNITY): Admission: RE | Payer: Self-pay | Source: Home / Self Care

## 2020-10-03 ENCOUNTER — Ambulatory Visit (HOSPITAL_COMMUNITY): Admission: RE | Admit: 2020-10-03 | Payer: 59 | Source: Home / Self Care | Admitting: Surgery

## 2020-10-03 SURGERY — CHOLECYSTECTOMY, ROBOT-ASSISTED, LAPAROSCOPIC
Anesthesia: General

## 2020-10-14 ENCOUNTER — Ambulatory Visit: Payer: No Typology Code available for payment source | Admitting: Critical Care Medicine

## 2020-10-29 ENCOUNTER — Encounter: Payer: 59 | Attending: Physical Medicine & Rehabilitation | Admitting: Physical Medicine & Rehabilitation
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
## 2020-12-15 ENCOUNTER — Ambulatory Visit: Payer: No Typology Code available for payment source | Admitting: Critical Care Medicine

## 2021-03-05 NOTE — Telephone Encounter (Signed)
error 

## 2022-02-09 ENCOUNTER — Emergency Department (HOSPITAL_COMMUNITY): Payer: Self-pay

## 2022-02-09 ENCOUNTER — Other Ambulatory Visit: Payer: Self-pay

## 2022-02-09 ENCOUNTER — Emergency Department (HOSPITAL_COMMUNITY)
Admission: EM | Admit: 2022-02-09 | Discharge: 2022-02-09 | Disposition: A | Discharge status: Home / Self Care | Payer: Self-pay | Attending: Emergency Medicine | Admitting: Emergency Medicine

## 2022-02-09 DIAGNOSIS — S63094A Other dislocation of right wrist and hand, initial encounter: Secondary | ICD-10-CM | POA: Insufficient documentation

## 2022-02-09 DIAGNOSIS — W010XXA Fall on same level from slipping, tripping and stumbling without subsequent striking against object, initial encounter: Secondary | ICD-10-CM | POA: Insufficient documentation

## 2022-02-09 MED ORDER — HYDROCODONE-ACETAMINOPHEN 5-325 MG PO TABS
1.0000 | ORAL_TABLET | Freq: Once | ORAL | Status: AC
  Administered 2022-02-09: 1 via ORAL
  Filled 2022-02-09: qty 1

## 2022-02-09 NOTE — ED Provider Notes (Signed)
2:26 AM I consulted with Dr. Susa Simmonds, on call for hand, who recommends finger traps and reduction.  Will move patient to a room for reduction.   Roxy Horseman, PA-C 02/09/22 0227    Gilda Crease, MD 02/09/22 [phone]

## 2022-02-09 NOTE — ED Triage Notes (Signed)
Via spanish interpreter, the pt states he fell while at work today, leaning on a 2x4 and it slid, he fell onto the right side. He has swelling and pain to the right hand and wrist area.

## 2022-02-09 NOTE — Discharge Instructions (Addendum)
Take Tylenol or Motrin for pain.

## 2022-02-09 NOTE — ED Provider Notes (Signed)
  [facility] Emergency Department Provider Note MRN:  [id number]  Arrival date & time: 02/09/22     Chief Complaint   Wrist Pain   History of Present Illness   Mike Sexton is a 31 y.o. year-old male presents to the ED with chief complaint of right wrist pain.  Had a FOOSH.  Was leaning against a 2x4 at work that tipped over and he fell with it.  Denies any other injuries.  History provided by patient. Professional spanish interpreter used through [employer].  Review of Systems  Pertinent positive and negative review of systems noted in HPI.    Physical Exam   Vitals:   02/09/22 0044  BP: (!) 141/81  Pulse: 72  Resp: 18  Temp: 98.6 F (37 C)  SpO2: 97%    CONSTITUTIONAL:  well-appearing, NAD NEURO:  Alert and oriented x 3, CN 3-12 grossly intact EYES:  eyes equal and reactive ENT/NECK:  Supple, no stridor  CARDIO:  normal rate, regular rhythm, appears well-perfused, intact distal pulses PULM:  No respiratory distress,  GI/GU:  non-distended,  MSK/SPINE: Swelling of the right wrist, TTP SKIN:  no rash, atraumatic   *Additional and/or pertinent findings included in MDM below  Diagnostic and Interventional Summary    Labs Reviewed - No data to display  DG Wrist Complete Right  Final Result    DG Hand Complete Right  Final Result    DG Wrist 2 Views Right    (Results Pending)  DG C-Arm 1-60 Min    (Results Pending)    Medications  HYDROcodone-acetaminophen (NORCO/VICODIN) 5-325 MG per tablet 1 tablet (1 tablet Oral Given 02/09/22 0110)     Procedures  /  Critical Care Procedures  ED Course and Medical Decision Making  I have reviewed the triage vital signs, the nursing notes, and pertinent available records from the EMR.  Social Determinants Affecting Complexity of Care: Patient has no clinically significant social determinants affecting this chief complaint..   ED Course:    Medical Decision  Making Amount and/or Complexity of Data Reviewed Radiology: ordered.  Risk Prescription drug management.     Consultants: I discussed the case with Dr. Susa Simmonds, who is appreciated for coming in and reducing the fx.   Treatment and Plan: Emergency department workup does not suggest an emergent condition requiring admission or immediate intervention beyond  what has been performed at this time. The patient is safe for discharge and has  been instructed to return immediately for worsening symptoms, change in  symptoms or any other concerns    Final Clinical Impressions(s) / ED Diagnoses     ICD-10-CM   1. Closed dislocation of perilunate joint of right wrist, initial encounter  [phone]       ED Discharge Orders     None         Discharge Instructions Discussed with and Provided to Patient:   Discharge Instructions   None      Roxy Horseman, PA-C 02/09/22 0335    Gilda Crease, MD 02/09/22 [phone]

## 2022-02-09 NOTE — ED Provider Triage Note (Signed)
  Emergency Medicine Provider Triage Evaluation Note  MRN:  [id number]  Arrival date & time: 02/09/22    Medically screening exam initiated at 12:52 AM.   CC:   Wrist Pain   HPI:  Mike Sexton is a 31 y.o. year-old male presents to the ED with chief complaint of FOOSH with wrist and hand pain.  History provided by patient. ROS:  -As included in HPI PE:   Vitals:   02/09/22 0044  BP: (!) 141/81  Pulse: 72  Resp: 18  Temp: 98.6 F (37 C)  SpO2: 97%    Non-toxic appearing No respiratory distress Swelling and TTP to the wrist and hand R MDM:  Based on signs and symptoms, fx is highest on my differential, followed by sprain. I've ordered imaging and meds in triage to expedite lab/diagnostic workup.  Patient was informed that the remainder of the evaluation will be completed by another provider, this initial triage assessment does not replace that evaluation, and the importance of remaining in the ED until their evaluation is complete.    Roxy Horseman, PA-C 02/09/22 [phone]

## 2022-02-09 NOTE — ED Notes (Signed)
Patient educated about not driving or performing other critical tasks (such as operating heavy machinery, caring for infant/toddler/child) due to sedative nature of narcotic medications received while in the ED.  Pt/caregiver verbalized understanding.   
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
## 2022-02-09 NOTE — Progress Notes (Signed)
Orthopedic Tech Progress Note Patient Details:  Chadwin Fury [facility] 1990/08/06 [id number]  Ortho Devices Type of Ortho Device: Sugartong splint, Arm sling Ortho Device/Splint Location: rue Ortho Device/Splint Interventions: Ordered, Application, Adjustment   Post Interventions Patient Tolerated: Well Instructions Provided: Care of device, Adjustment of device  Trinna Post 02/09/2022, 3:59 AM

## 2022-02-10 ENCOUNTER — Other Ambulatory Visit: Payer: Self-pay | Admitting: Orthopedic Surgery

## 2022-02-11 ENCOUNTER — Encounter (HOSPITAL_COMMUNITY): Payer: Self-pay | Admitting: Orthopedic Surgery

## 2022-02-12 ENCOUNTER — Ambulatory Visit (HOSPITAL_COMMUNITY)
Admission: RE | Admit: 2022-02-12 | Payer: No Typology Code available for payment source | Source: Home / Self Care | Admitting: Orthopedic Surgery

## 2022-02-12 ENCOUNTER — Encounter (HOSPITAL_COMMUNITY): Admission: RE | Payer: Self-pay | Source: Home / Self Care

## 2022-02-12 HISTORY — DX: Anemia, unspecified: D64.9

## 2022-02-12 SURGERY — OPEN REDUCTION INTERNAL FIXATION (ORIF) WRIST FRACTURE
Anesthesia: Monitor Anesthesia Care | Site: Wrist | Laterality: Right

## 2022-04-02 IMAGING — DX DG ABDOMEN 1V
1 series · 1 of 1 positions shown · non-contrast
Comparison: None.

CLINICAL DATA: Generalized abdominal pain and constipation x3 days.

EXAM:
ABDOMEN - 1 VIEW

[x abdomen supine]
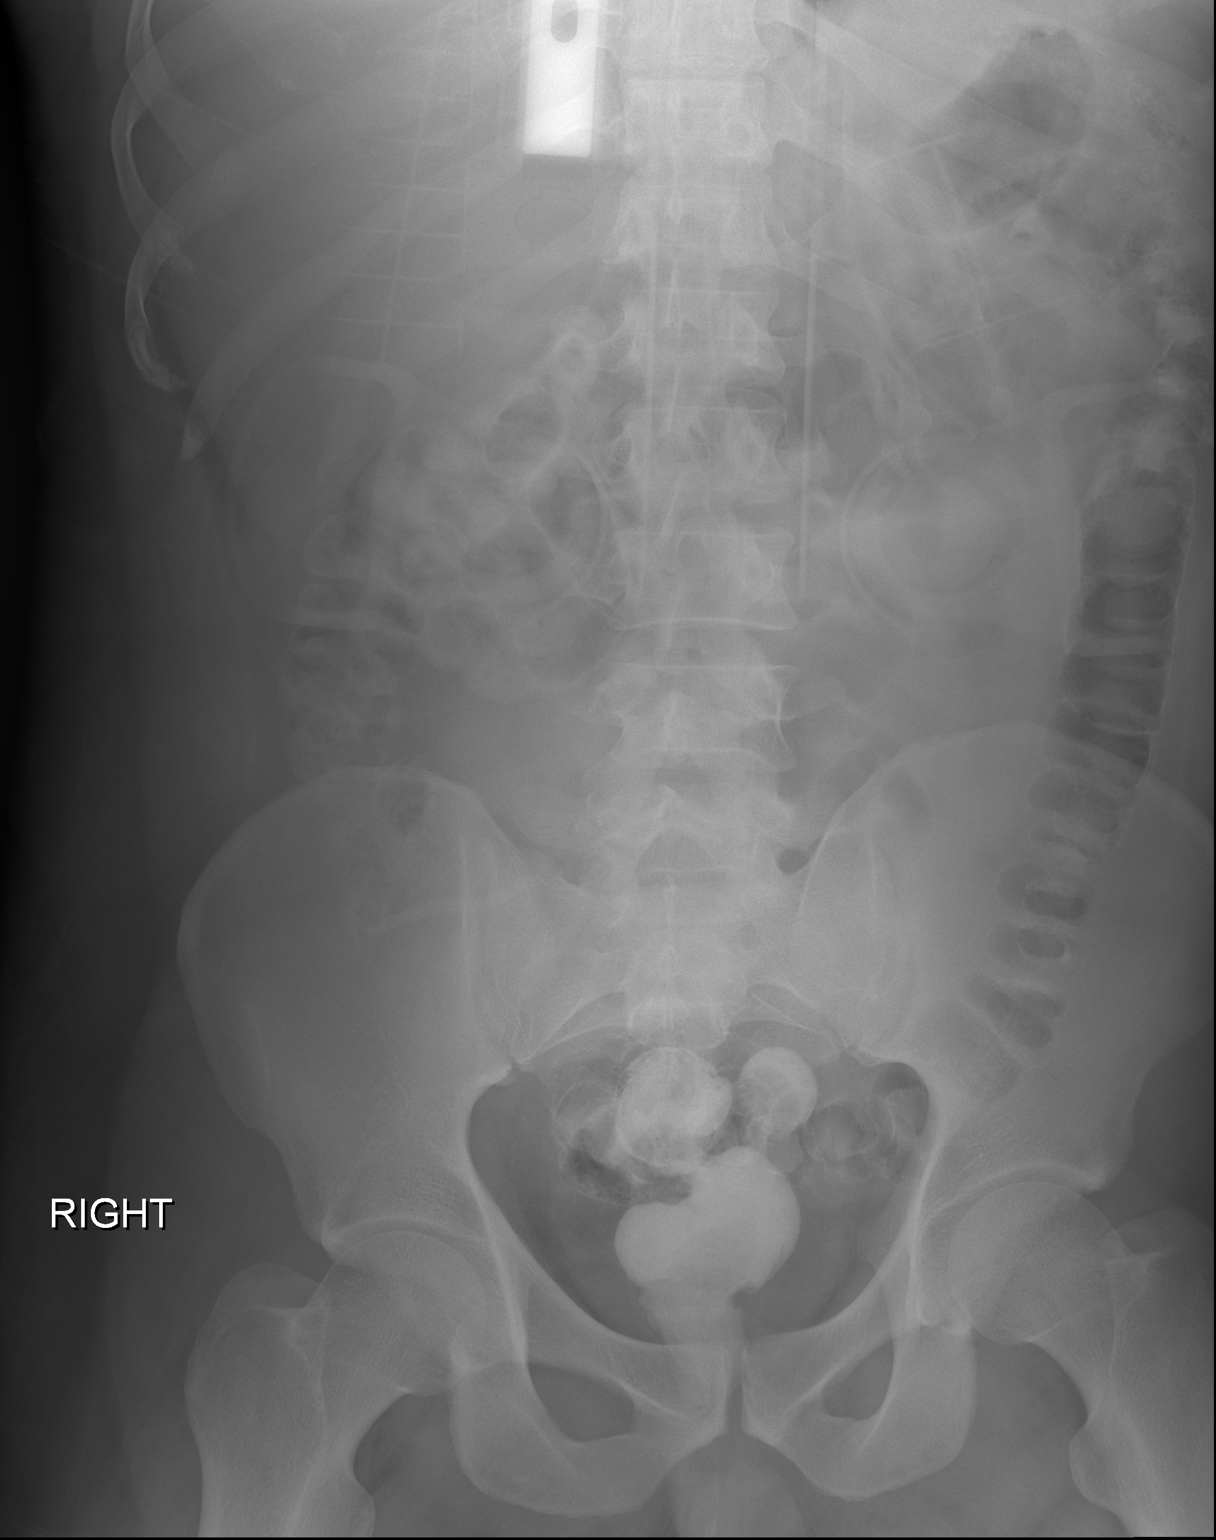

[1 of 1 positions shown; findings below may reference images not displayed]

FINDINGS: The bowel gas pattern is normal. A minimal stool burden is noted. A
mild amount of radiopaque contrast is seen within the large bowel.
No radio-opaque calculi or other significant radiographic
abnormality are seen.
IMPRESSION: Negative.

## 2022-05-28 IMAGING — CT CT HEAD W/O CM
1 series · 16 of 30 positions shown, 20 images · non-contrast
Comparison: None.

CLINICAL DATA: Follow-up subarachnoid.

EXAM:
CT HEAD WITHOUT CONTRAST
TECHNIQUE: Contiguous axial images were obtained from the base of the skull
through the vertex without intravenous contrast.

[Series 2: head w/(date) · axial · 0.49mm/px · z∈[+797,+957]mm · 16 of 36 slices shown, 20 images]
[im 2/36  brain]
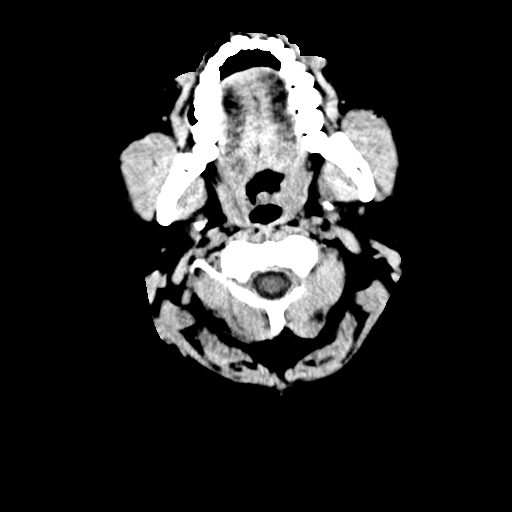
[im 2/36  bone]
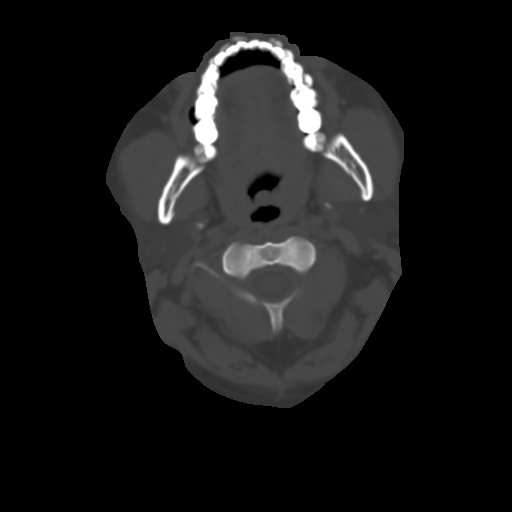
[im 4/36  brain]
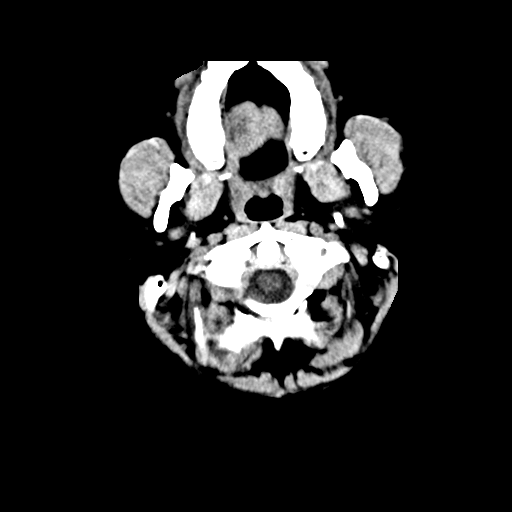
[im 7/36  brain]
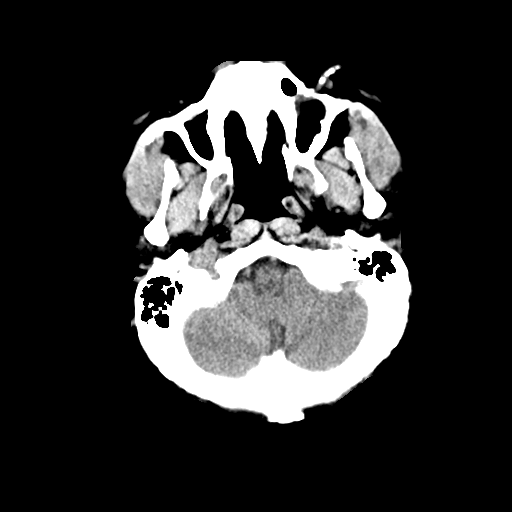
[im 9/36  brain]
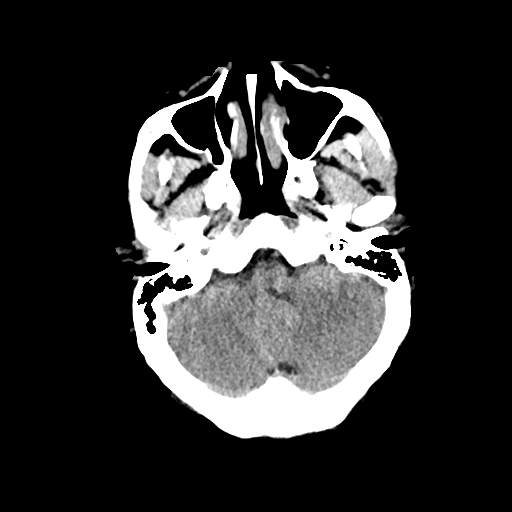
[im 10/36  brain]
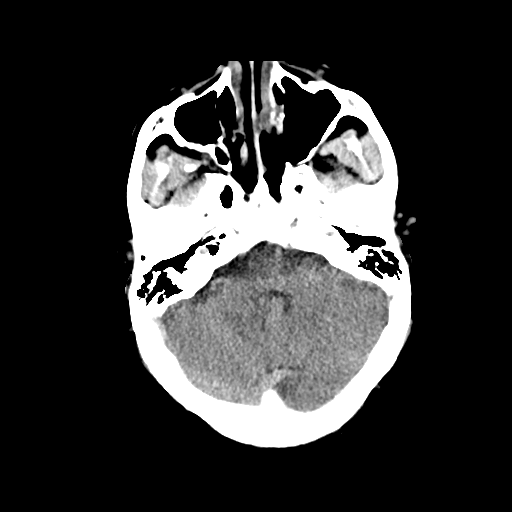
[im 10/36  bone]
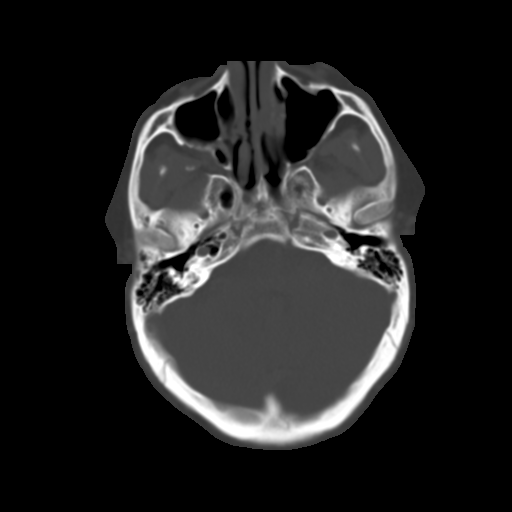
[im 13/36  brain]
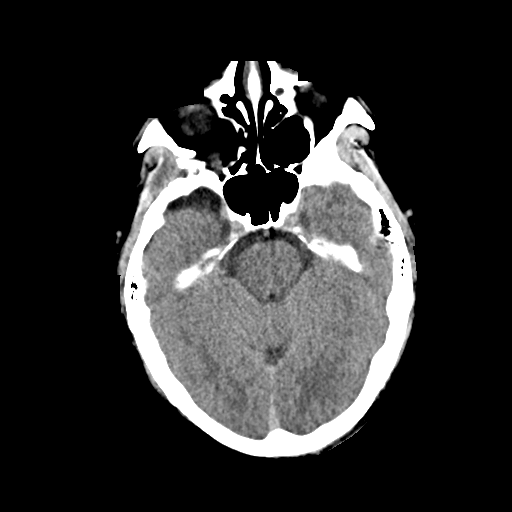
[im 15/36  brain]
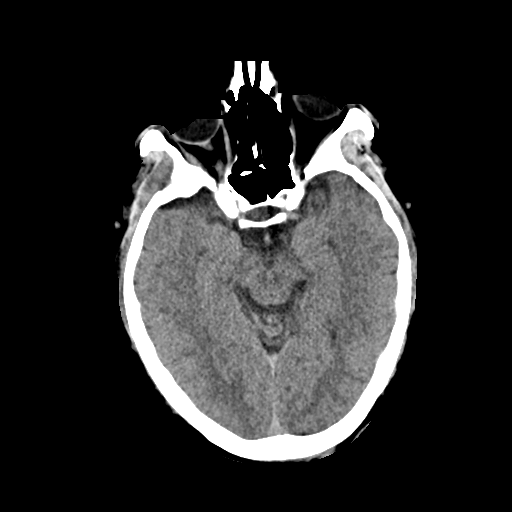
[im 17/36  brain]
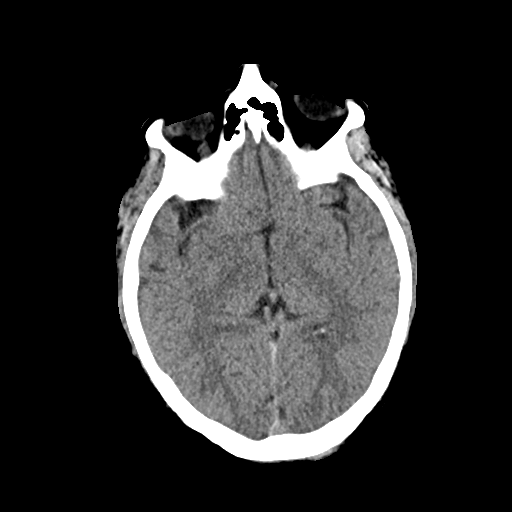
[im 19/36  brain]
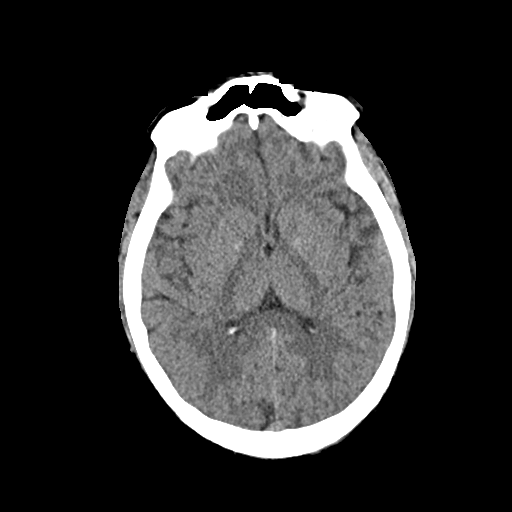
[im 19/36  bone]
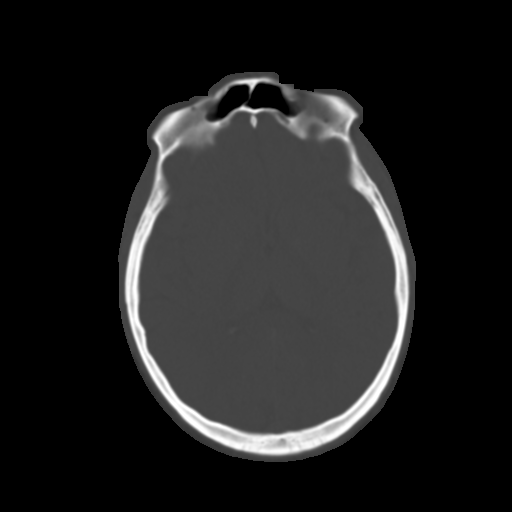
[im 21/36  brain]
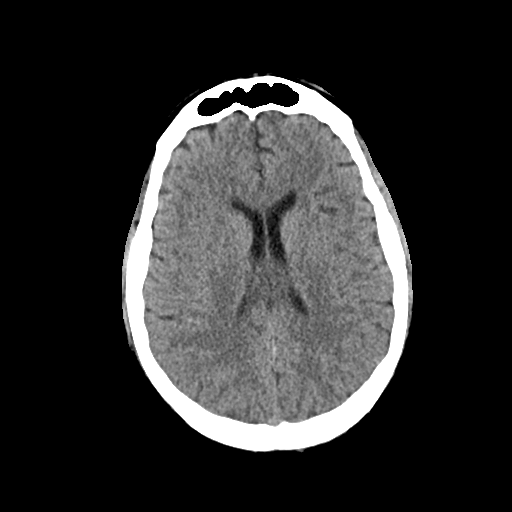
[im 23/36  brain]
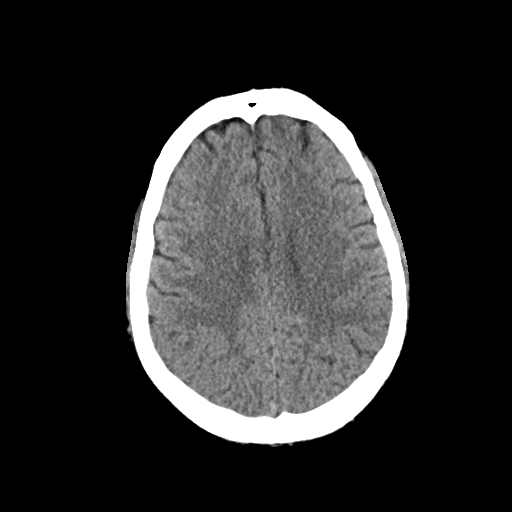
[im 26/36  brain]
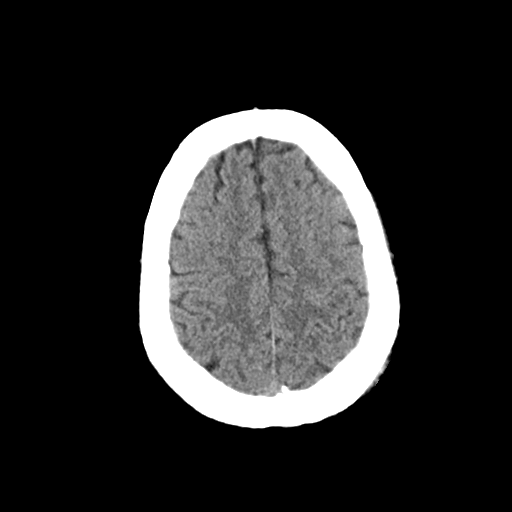
[im 27/36  brain]
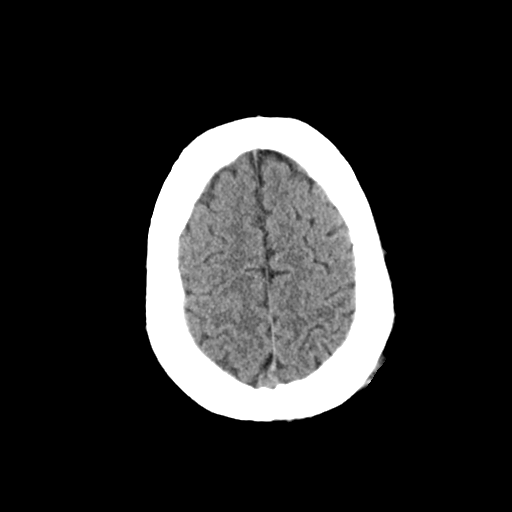
[im 27/36  bone]
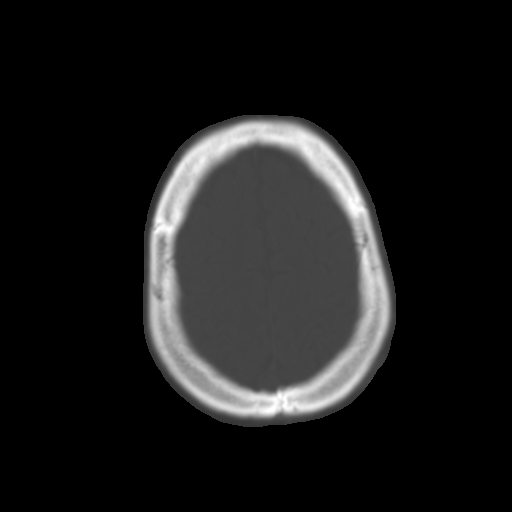
[im 29/36  brain]
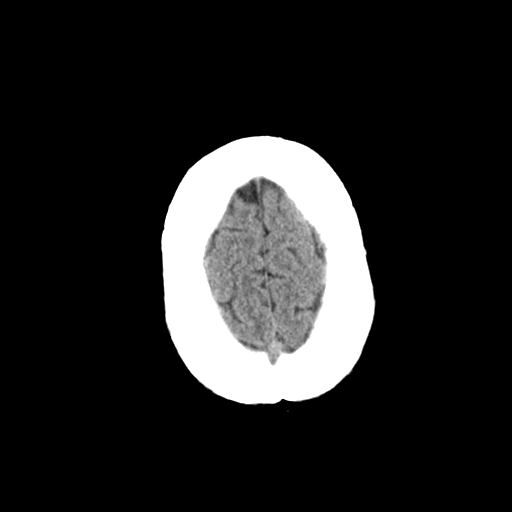
[im 32/36  brain]
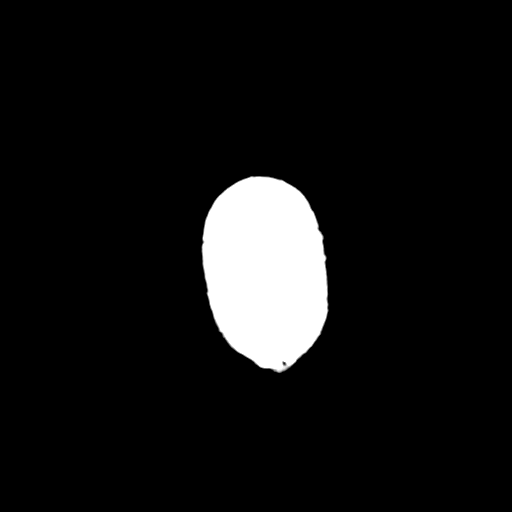
[im 34/36  brain]
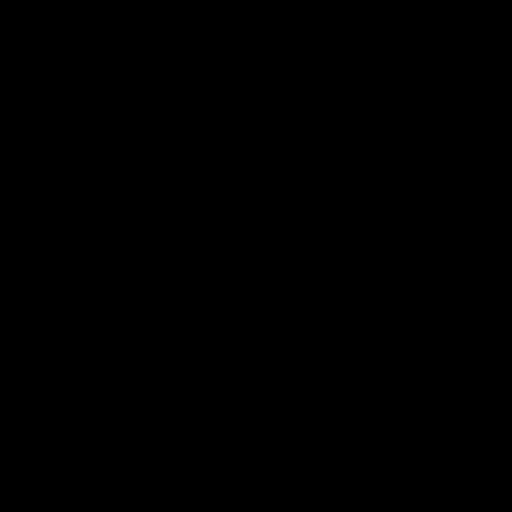

[16 of 30 positions shown; findings below may reference images not displayed]

FINDINGS: Brain: No evidence of acute infarction, hemorrhage, hydrocephalus,
extra-axial collection or mass lesion/mass effect. Mild hyperdensity
within the anterior pons is likely artifact.

Vascular: No hyperdense vessel or unexpected calcification.

Skull: Greater wing of right sphenoid comminuted fractures.

Sinuses/Orbits: Medial, lateral inferior right orbital wall
fractures with mild narrowing of the right optic canal. Stranding of
the inferior intraconal fat and swelling of the inferior rectus
muscle. Fractures of the superior, lateral and medial right
maxillary sinus walls and anterior left maxillary sinus.

Other: None.
IMPRESSION: 1. No acute intracranial pathology. Specifically no evidence of
residual or recurrence arachnoid hemorrhage.
2. Multiple facial fractures and greater wing of right sphenoid
fracture.

## 2024-02-23 ENCOUNTER — Other Ambulatory Visit (HOSPITAL_BASED_OUTPATIENT_CLINIC_OR_DEPARTMENT_OTHER): Payer: Self-pay
